# Patient Record
Sex: Male | Born: 1940 | Race: White | Hispanic: No | Smoking: Former smoker
Health system: Southern US, Community
[De-identification: ages and names within clinical notes are randomized; demographics above are authoritative.]

## PROBLEM LIST (undated history)

## (undated) DIAGNOSIS — L570 Actinic keratosis: Secondary | ICD-10-CM

## (undated) DIAGNOSIS — D631 Anemia in chronic kidney disease: Secondary | ICD-10-CM

## (undated) DIAGNOSIS — N529 Male erectile dysfunction, unspecified: Secondary | ICD-10-CM

## (undated) DIAGNOSIS — G8220 Paraplegia, unspecified: Secondary | ICD-10-CM

## (undated) DIAGNOSIS — I10 Essential (primary) hypertension: Secondary | ICD-10-CM

## (undated) DIAGNOSIS — N183 Chronic kidney disease, stage 3 (moderate): Secondary | ICD-10-CM

## (undated) DIAGNOSIS — K5900 Constipation, unspecified: Secondary | ICD-10-CM

## (undated) DIAGNOSIS — K219 Gastro-esophageal reflux disease without esophagitis: Secondary | ICD-10-CM

## (undated) DIAGNOSIS — N400 Enlarged prostate without lower urinary tract symptoms: Secondary | ICD-10-CM

## (undated) DIAGNOSIS — N189 Chronic kidney disease, unspecified: Secondary | ICD-10-CM

## (undated) DIAGNOSIS — G629 Polyneuropathy, unspecified: Secondary | ICD-10-CM

## (undated) DIAGNOSIS — E119 Type 2 diabetes mellitus without complications: Secondary | ICD-10-CM

## (undated) HISTORY — DX: Gastro-esophageal reflux disease without esophagitis: K21.9

## (undated) HISTORY — DX: Chronic kidney disease, stage 3 (moderate): N18.3

## (undated) HISTORY — PX: BACK SURGERY: SHX140

## (undated) HISTORY — DX: Essential (primary) hypertension: I10

## (undated) HISTORY — DX: Polyneuropathy, unspecified: G62.9

## (undated) HISTORY — DX: Benign prostatic hyperplasia without lower urinary tract symptoms: N40.0

## (undated) HISTORY — DX: Type 2 diabetes mellitus without complications: E11.9

## (undated) HISTORY — DX: Actinic keratosis: L57.0

## (undated) HISTORY — DX: Paraplegia, unspecified: G82.20

## (undated) HISTORY — DX: Male erectile dysfunction, unspecified: N52.9

## (undated) HISTORY — DX: Anemia in chronic kidney disease: D63.1

## (undated) HISTORY — DX: Chronic kidney disease, unspecified: N18.9

## (undated) HISTORY — DX: Constipation, unspecified: K59.00

## (undated) HISTORY — PX: REPLACEMENT TOTAL KNEE: SUR1224

---

## 2011-05-05 DIAGNOSIS — N400 Enlarged prostate without lower urinary tract symptoms: Secondary | ICD-10-CM | POA: Insufficient documentation

## 2011-05-05 DIAGNOSIS — N529 Male erectile dysfunction, unspecified: Secondary | ICD-10-CM | POA: Insufficient documentation

## 2011-05-05 DIAGNOSIS — F419 Anxiety disorder, unspecified: Secondary | ICD-10-CM | POA: Insufficient documentation

## 2011-05-05 DIAGNOSIS — G8929 Other chronic pain: Secondary | ICD-10-CM | POA: Insufficient documentation

## 2011-05-05 HISTORY — DX: Male erectile dysfunction, unspecified: N52.9

## 2011-05-05 HISTORY — DX: Benign prostatic hyperplasia without lower urinary tract symptoms: N40.0

## 2011-07-12 DIAGNOSIS — K592 Neurogenic bowel, not elsewhere classified: Secondary | ICD-10-CM | POA: Insufficient documentation

## 2011-07-12 DIAGNOSIS — N319 Neuromuscular dysfunction of bladder, unspecified: Secondary | ICD-10-CM | POA: Insufficient documentation

## 2012-04-12 DIAGNOSIS — N189 Chronic kidney disease, unspecified: Secondary | ICD-10-CM | POA: Insufficient documentation

## 2012-04-12 DIAGNOSIS — D631 Anemia in chronic kidney disease: Secondary | ICD-10-CM

## 2012-04-12 HISTORY — DX: Anemia in chronic kidney disease: D63.1

## 2012-04-12 HISTORY — DX: Chronic kidney disease, unspecified: N18.9

## 2013-09-17 DIAGNOSIS — Z87440 Personal history of urinary (tract) infections: Secondary | ICD-10-CM | POA: Insufficient documentation

## 2013-09-17 DIAGNOSIS — I10 Essential (primary) hypertension: Secondary | ICD-10-CM

## 2013-09-17 DIAGNOSIS — G8220 Paraplegia, unspecified: Secondary | ICD-10-CM

## 2013-09-17 DIAGNOSIS — K219 Gastro-esophageal reflux disease without esophagitis: Secondary | ICD-10-CM | POA: Insufficient documentation

## 2013-09-17 DIAGNOSIS — E785 Hyperlipidemia, unspecified: Secondary | ICD-10-CM | POA: Insufficient documentation

## 2013-09-17 DIAGNOSIS — G4700 Insomnia, unspecified: Secondary | ICD-10-CM | POA: Insufficient documentation

## 2013-09-17 HISTORY — DX: Essential (primary) hypertension: I10

## 2013-09-17 HISTORY — DX: Gastro-esophageal reflux disease without esophagitis: K21.9

## 2013-09-17 HISTORY — DX: Paraplegia, unspecified: G82.20

## 2013-10-23 DIAGNOSIS — N183 Chronic kidney disease, stage 3 unspecified: Secondary | ICD-10-CM

## 2013-10-23 DIAGNOSIS — G629 Polyneuropathy, unspecified: Secondary | ICD-10-CM | POA: Insufficient documentation

## 2013-10-23 HISTORY — DX: Chronic kidney disease, stage 3 unspecified: N18.30

## 2013-10-23 HISTORY — DX: Polyneuropathy, unspecified: G62.9

## 2014-01-22 DIAGNOSIS — K5900 Constipation, unspecified: Secondary | ICD-10-CM | POA: Insufficient documentation

## 2014-01-22 HISTORY — DX: Constipation, unspecified: K59.00

## 2015-09-28 DIAGNOSIS — E119 Type 2 diabetes mellitus without complications: Secondary | ICD-10-CM

## 2015-09-28 HISTORY — DX: Type 2 diabetes mellitus without complications: E11.9

## 2015-11-26 DIAGNOSIS — E559 Vitamin D deficiency, unspecified: Secondary | ICD-10-CM | POA: Insufficient documentation

## 2015-12-03 DIAGNOSIS — Z9181 History of falling: Secondary | ICD-10-CM | POA: Insufficient documentation

## 2016-02-05 ENCOUNTER — Ambulatory Visit (INDEPENDENT_AMBULATORY_CARE_PROVIDER_SITE_OTHER): Payer: 59 | Admitting: Urology

## 2016-02-05 ENCOUNTER — Encounter: Payer: Self-pay | Admitting: Urology

## 2016-02-05 VITALS — BP 148/84 | HR 59 | Ht 73.0 in | Wt 230.4 lb

## 2016-02-05 DIAGNOSIS — N319 Neuromuscular dysfunction of bladder, unspecified: Secondary | ICD-10-CM

## 2016-02-05 NOTE — Progress Notes (Signed)
02/05/2016 11:36 AM   Hector Simmons 1941-05-23 [id number]  Referring provider: Oswaldo Conroy, MD [address] [place], [place] [address]  Chief Complaint  Patient presents with  . New Patient (Initial Visit)    neurogenic bladder     HPI: The patient had C-spine and lumbar spine surgery in 2010 and I believe had a dural leak and this had a neurogenic bladder since. He catheterizes every 4 hours and rarely leaks. He gets an occasional urinary tract infection. He might feel the void spontaneously but rarely. He's never had a kidney stone or previous GU surgery. He is on oral hypoglycemics. He usually is treated with ciprofloxacin.  Modifying factors: There are no other modifying factors  Associated signs and symptoms: There are no other associated signs and symptoms Aggravating and relieving factors: There are no other aggravating or relieving factors Severity: Moderate Duration: Persistent   PMH: No past medical history on file.  Surgical History: No past surgical history on file.  Home Medications:    Medication List       This list is accurate as of: 02/05/16 11:36 AM.  Always use your most recent med list.               aspirin EC 81 MG tablet  Take 81 mg by mouth.     carvedilol 6.25 MG tablet  Commonly known as:  COREG  Take 6.25 mg by mouth.     ciprofloxacin 500 MG tablet  Commonly known as:  CIPRO  Take 500 mg by mouth.     glimepiride 4 MG tablet  Commonly known as:  AMARYL  Take 4 mg by mouth.     lisinopril 40 MG tablet  Commonly known as:  PRINIVIL,ZESTRIL     lovastatin 40 MG tablet  Commonly known as:  MEVACOR     omeprazole 20 MG capsule  Commonly known as:  PRILOSEC     tamsulosin 0.4 MG Caps capsule  Commonly known as:  FLOMAX  Take 0.4 mg by mouth.     traZODone 50 MG tablet  Commonly known as:  DESYREL     Trospium Chloride 60 MG Cp24  Take 60 mg by mouth. Reported on 02/05/2016        Allergies: Not  on File  Family History: No family history on file.  Social History:  reports that he has quit smoking. He does not have any smokeless tobacco history on file. He reports that he drinks alcohol. He reports that he does not use illicit drugs.  ROS: UROLOGY Frequent Urination?: No Hard to postpone urination?: Yes Burning/pain with urination?: No Get up at night to urinate?: Yes Leakage of urine?: Yes Urine stream starts and stops?: No Trouble starting stream?: Yes Do you have to strain to urinate?: Yes Blood in urine?: No Urinary tract infection?: Yes Sexually transmitted disease?: No Injury to kidneys or bladder?: Yes Painful intercourse?: No Weak stream?: No Erection problems?: Yes Penile pain?: Yes  Gastrointestinal Nausea?: No Vomiting?: No Indigestion/heartburn?: No Diarrhea?: No Constipation?: Yes  Constitutional Fever: No Night sweats?: No Weight loss?: No Fatigue?: No  Skin Skin rash/lesions?: No Itching?: No  Eyes Blurred vision?: No Double vision?: No  Ears/Nose/Throat Sore throat?: No Sinus problems?: No  Hematologic/Lymphatic Swollen glands?: No Easy bruising?: No  Cardiovascular Leg swelling?: No Chest pain?: No  Respiratory Cough?: No Shortness of breath?: No  Endocrine Excessive thirst?: No  Musculoskeletal Back pain?: No Joint pain?: No  Neurological Headaches?: No  Dizziness?: No  Psychologic Depression?: Yes Anxiety?: Yes  Physical Exam: BP 148/84 mmHg  Pulse 59  Ht 6\' 1"  (1.854 m)  Wt 230 lb 6.4 oz (104.509 kg)  BMI 30.40 kg/m2  Constitutional:  Alert and oriented, No acute distress. HEENT: [place] AT, moist mucus membranes.  Trachea midline, no masses. Cardiovascular: No clubbing, cyanosis, or edema. Respiratory: Normal respiratory effort, no increased work of breathing. GI: Abdomen is soft, nontender, nondistended, no abdominal masses GU: No CVA tenderness. Decrease sphincter tone and small benign prostate Skin: No  rashes, bruises or suspicious lesions. Lymph: No cervical or inguinal adenopathy. Neurologic: Grossly intact, no focal deficits, moving all 4 extremities. Psychiatric: Normal mood and affect.  Laboratory Data:  Urinalysis No results found for: COLORURINE, APPEARANCEUR, LABSPEC, PHURINE, GLUCOSEU, HGBUR, BILIRUBINUR, KETONESUR, PROTEINUR, UROBILINOGEN, NITRITE, LEUKOCYTESUR  Pertinent Imaging: None  Assessment & Plan:  The patient has a neurogenic bladder clinically. He is very stable and I'm not going to order urodynamics at this stage. He would like to transfer his care here. He does take [facility] and. He is on Flomax. Role of renal ultrasound discussed.  After we spoke we both decided to stop the Flomax. I will get a baseline ultrasound a reassessment in 1 month and then likely when necessary and annually  There are no diagnoses linked to this encounter.  No Follow-up on file.  Martina Sinner, MD  [facility] [address], [address] [place], [place] [address] [phone]

## 2016-02-22 ENCOUNTER — Ambulatory Visit: Payer: Self-pay

## 2016-02-24 ENCOUNTER — Ambulatory Visit
Admission: RE | Admit: 2016-02-24 | Discharge: 2016-02-24 | Disposition: A | Discharge status: Home / Self Care | Payer: Medicare HMO | Source: Ambulatory Visit | Attending: Urology | Admitting: Urology

## 2016-02-24 DIAGNOSIS — N281 Cyst of kidney, acquired: Secondary | ICD-10-CM | POA: Diagnosis not present

## 2016-02-24 DIAGNOSIS — N319 Neuromuscular dysfunction of bladder, unspecified: Secondary | ICD-10-CM | POA: Insufficient documentation

## 2016-02-29 ENCOUNTER — Ambulatory Visit (INDEPENDENT_AMBULATORY_CARE_PROVIDER_SITE_OTHER): Payer: Medicare HMO | Admitting: Urology

## 2016-02-29 ENCOUNTER — Encounter: Payer: Self-pay | Admitting: Urology

## 2016-02-29 ENCOUNTER — Ambulatory Visit: Payer: 59

## 2016-02-29 VITALS — BP 105/66 | HR 72 | Ht 73.0 in | Wt 228.3 lb

## 2016-02-29 DIAGNOSIS — N319 Neuromuscular dysfunction of bladder, unspecified: Secondary | ICD-10-CM | POA: Diagnosis not present

## 2016-02-29 NOTE — Progress Notes (Signed)
02/29/2016 3:47 PM   Hector Simmons Nov 04, 1940 [id number]  Referring provider: Oswaldo Conroy, MD [address] [place] [place], [place] [address]  Chief Complaint  Patient presents with  . Follow-up    neurogenic bladder     HPI: The patient had C-spine and lumbar spine surgery in 2010 and I believe had a dural leak and this had a neurogenic bladder since. He catheterizes every 4 hours and rarely leaks. He gets an occasional urinary tract infection. He might feel the void spontaneously but rarely. He's never had a kidney stone or previous GU surgery. He is on oral hypoglycemics. He usually is treated with ciprofloxacin.  Assessment & Plan: The patient has a neurogenic bladder clinically. He is very stable and I'm not going to order urodynamics at this stage. He would like to transfer his care here. He does take [facility] and. He is on Flomax. Role of renal ultrasound discussed.  After we spoke we both decided to stop the Flomax. I will get a baseline ultrasound a reassessment in 1 month and then likely when necessary and annually  The renal ultrasound is normal  Incomplete bladder emptying stable. No infections.   PMH: Past Medical History  Diagnosis Date  . BP (high blood pressure) 09/17/2013    Last Assessment & Plan:  Hypertension is stable. Continue current treatment regimen. Dietary sodium restriction. Regular aerobic exercise. Continue current medications.   . CN (constipation) 01/22/2014    Last Assessment & Plan:  Start PEG powder every day.  incr water (hard w his situation fo self cathing).    . Acid reflux 09/17/2013    Last Assessment & Plan:  Symptoms managed on ppi, continue current regimen   . Type 2 diabetes mellitus (HCC) 09/28/2015    Overview:  cataract Last Assessment & Plan:  Renal condition is stable. Continue current treatment regimen. Regular aerobic exercise. Continue current medications.   . 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  Last Assessment & Plan:  Refer to podiatry one time per year.    . 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  Last Assessment & Plan:  Continue current exercise regimen to maximize lower extremity strength.  Discussed PT with patient, but patient reports being able to do exercises on his own as taught.     . 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 Chronic kidney disease (CKD), stage III (moderate) 10/23/2013    Last Assessment & Plan:  Renal condition is stable. Continue current treatment regimen.   . 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    . 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  Home Medications:    Medication List       This list is accurate as of: 02/29/16  3:47 PM.  Always use your most recent med list.               aspirin EC 81 MG tablet  Take 81 mg by mouth.     carvedilol 6.25 MG tablet  Commonly known as:  COREG  Take 6.25 mg by mouth.     ciprofloxacin 500 MG tablet  Commonly known as:  CIPRO  Take 500 mg by mouth. Reported on 02/29/2016  glimepiride 4 MG tablet  Commonly known as:  AMARYL  Take 4 mg by mouth.     lisinopril 40 MG tablet  Commonly known as:  PRINIVIL,ZESTRIL     lovastatin 40 MG tablet  Commonly known as:  MEVACOR     omeprazole 20 MG capsule  Commonly known as:  PRILOSEC     tamsulosin 0.4 MG Caps capsule  Commonly known as:  FLOMAX  Take 0.4 mg by mouth. Reported on 02/29/2016     traZODone 50 MG tablet  Commonly known as:  DESYREL     Trospium Chloride 60 MG Cp24  Take 60 mg by mouth. Reported on 02/05/2016        Allergies: No Known Allergies  Family History: No family history on file.  Social History:  reports that he has quit smoking. He does not have any  smokeless tobacco history on file. He reports that he drinks alcohol. He reports that he does not use illicit drugs.  ROS: UROLOGY Frequent Urination?: No Hard to postpone urination?: No Burning/pain with urination?: No Get up at night to urinate?: No Leakage of urine?: No Urine stream starts and stops?: No Trouble starting stream?: No Do you have to strain to urinate?: No Blood in urine?: No Urinary tract infection?: No Sexually transmitted disease?: No Injury to kidneys or bladder?: No Painful intercourse?: No Weak stream?: No Erection problems?: Yes Penile pain?: No  Gastrointestinal Nausea?: No Vomiting?: No Indigestion/heartburn?: No Diarrhea?: No Constipation?: Yes  Constitutional Fever: No Night sweats?: No Weight loss?: No Fatigue?: No  Skin Skin rash/lesions?: No Itching?: No  Eyes Blurred vision?: No Double vision?: No  Ears/Nose/Throat Sore throat?: No Sinus problems?: No  Hematologic/Lymphatic Swollen glands?: No Easy bruising?: No  Cardiovascular Leg swelling?: No Chest pain?: No  Respiratory Cough?: No Shortness of breath?: No  Endocrine Excessive thirst?: No  Musculoskeletal Back pain?: No Joint pain?: No  Neurological Headaches?: No Dizziness?: No  Psychologic Depression?: No Anxiety?: No  Physical Exam: BP 105/66 mmHg  Pulse 72  Ht 6\' 1"  (1.854 m)  Wt 228 lb 4.8 oz (103.556 kg)  BMI 30.13 kg/m2    Laboratory Data:  Urinalysis No results found for: COLORURINE, APPEARANCEUR, LABSPEC, PHURINE, GLUCOSEU, HGBUR, BILIRUBINUR, KETONESUR, PROTEINUR, UROBILINOGEN, NITRITE, LEUKOCYTESUR  Pertinent Imaging: As above  Assessment & Plan:  The patient has a stable neurogenic bladder on [place] with a normal renal ultrasound He takes trospium when necessary when he is traveling to [place] but otherwise does not take it every day   Martina Sinner, MD  [facility] [address] [place], [place] [address] [phone]

## 2016-07-28 ENCOUNTER — Telehealth: Payer: Self-pay | Admitting: Urology

## 2016-07-28 DIAGNOSIS — N319 Neuromuscular dysfunction of bladder, unspecified: Secondary | ICD-10-CM

## 2016-07-28 MED ORDER — TROSPIUM CHLORIDE ER 60 MG PO CP24: 60.0000 mg | ORAL_CAPSULE | Freq: Every day | ORAL | 10 refills | Status: DC | PRN

## 2016-07-28 NOTE — Telephone Encounter (Signed)
Refill sent, trospium is generic for sanctura

## 2016-07-28 NOTE — Telephone Encounter (Signed)
Pt called asking for a refill of his Trospum Chloride 60mg  sent to [facility] on [address] in [place]. If there is a generic, pt would like to have that.  Please advise.

## 2016-08-08 ENCOUNTER — Telehealth: Payer: Self-pay | Admitting: Urology

## 2016-08-08 NOTE — Telephone Encounter (Signed)
Needs refill from [facility] for Ciproflaxin.  He said MacDiarmid would need to call this in, because other has expired.  Please give pt a call [phone]

## 2016-08-19 ENCOUNTER — Other Ambulatory Visit: Payer: Self-pay

## 2016-08-19 DIAGNOSIS — N390 Urinary tract infection, site not specified: Secondary | ICD-10-CM

## 2016-08-19 MED ORDER — CIPROFLOXACIN HCL 500 MG PO TABS: 500.0000 mg | ORAL_TABLET | ORAL | 3 refills | Status: DC | PRN

## 2016-08-22 ENCOUNTER — Other Ambulatory Visit: Payer: Self-pay

## 2016-08-22 DIAGNOSIS — N390 Urinary tract infection, site not specified: Secondary | ICD-10-CM

## 2016-08-22 MED ORDER — CIPROFLOXACIN HCL 500 MG PO TABS: 500.0000 mg | ORAL_TABLET | ORAL | 3 refills | Status: DC | PRN

## 2016-09-08 ENCOUNTER — Telehealth: Payer: Self-pay

## 2016-09-08 DIAGNOSIS — N390 Urinary tract infection, site not specified: Secondary | ICD-10-CM

## 2016-09-08 MED ORDER — CIPROFLOXACIN HCL 500 MG PO TABS: 500.0000 mg | ORAL_TABLET | ORAL | 3 refills | Status: DC | PRN

## 2016-09-08 NOTE — Telephone Encounter (Signed)
Error

## 2017-02-22 ENCOUNTER — Ambulatory Visit
Admission: RE | Admit: 2017-02-22 | Discharge: 2017-02-22 | Disposition: A | Discharge status: Home / Self Care | Payer: Medicare PPO | Source: Ambulatory Visit | Attending: Urology | Admitting: Urology

## 2017-02-22 DIAGNOSIS — N261 Atrophy of kidney (terminal): Secondary | ICD-10-CM | POA: Diagnosis not present

## 2017-02-22 DIAGNOSIS — N281 Cyst of kidney, acquired: Secondary | ICD-10-CM | POA: Insufficient documentation

## 2017-02-22 DIAGNOSIS — N319 Neuromuscular dysfunction of bladder, unspecified: Secondary | ICD-10-CM | POA: Diagnosis not present

## 2017-03-06 ENCOUNTER — Ambulatory Visit: Payer: Medicare HMO

## 2017-03-13 ENCOUNTER — Ambulatory Visit: Payer: Medicare PPO | Admitting: Urology

## 2017-03-13 ENCOUNTER — Encounter: Payer: Self-pay | Admitting: Urology

## 2017-03-13 VITALS — BP 118/61 | HR 79 | Ht 73.0 in | Wt 235.8 lb

## 2017-03-13 DIAGNOSIS — R339 Retention of urine, unspecified: Secondary | ICD-10-CM | POA: Diagnosis not present

## 2017-03-13 MED ORDER — OXYBUTYNIN CHLORIDE ER 10 MG PO TB24: 10.0000 mg | ORAL_TABLET | Freq: Every day | ORAL | 11 refills | Status: DC

## 2017-03-13 MED ORDER — TOLTERODINE TARTRATE ER 4 MG PO CP24: 4.0000 mg | ORAL_CAPSULE | Freq: Every day | ORAL | 11 refills | Status: DC

## 2017-03-13 NOTE — Progress Notes (Signed)
03/13/2017 1:41 PM   Hector Simmons 1941-06-10 [id number]  Referring provider: Oswaldo Conroy, MD [address] [place] [address] [place], [place] [address]  Chief Complaint  Patient presents with  . Follow-up    RUS results    HPI: The patient had C-spine and lumbar spine surgery in 2010 and I believe had a dural leak and this had a neurogenic bladder since. He catheterizes every 4 hours and rarely leaks. He gets an occasional urinary tract infection. He might feel the void spontaneously but rarely.He usually is treated with ciprofloxacin.  Assessment & Plan: The patient has a neurogenic bladder clinically. He is very stable and I'm not going to order urodynamics at this stage. He would like to transfer his care here. He does take [facility] and. He is on Flomax.   After we spoke we both decided to stop the Flomax. I will get a baseline ultrasound a reassessment in 1 month and then likely when necessary and annually  Today  The renal ultrasound is normal  Catheterizes 6 or 7 times a day. Little to no sensation in the perineal area. 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     PMH: Past Medical History:  Diagnosis Date  . Acid reflux 09/17/2013   Last Assessment & Plan:  Symptoms managed on ppi, continue current regimen   . 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    . BP (high blood pressure) 09/17/2013   Last Assessment & Plan:  Hypertension is stable. Continue current treatment regimen. Dietary sodium restriction. Regular aerobic exercise. Continue current medications.   . Chronic kidney disease (CKD), stage III (moderate) 10/23/2013   Last Assessment & Plan:  Renal condition is stable. Continue current treatment regimen.   . CN (constipation) 01/22/2014   Last Assessment & Plan:   Start PEG powder every day.  incr water (hard w his situation fo self cathing).    . 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  Last Assessment & Plan:  Continue current exercise regimen to maximize lower extremity strength.  Discussed PT with patient, but patient reports being able to do exercises on his own as taught.     . 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  Last Assessment & Plan:  Refer to podiatry one time per year.    . Type 2 diabetes mellitus (HCC) 09/28/2015   Overview:  cataract Last Assessment & Plan:  Renal condition is stable. Continue current treatment regimen. Regular aerobic exercise. Continue current medications.     Surgical History: History reviewed. No pertinent surgical history.  Home Medications:  Allergies as of 03/13/2017      Reactions   No Known Allergies       Medication List       Accurate as of 03/13/17  1:41 PM. Always use your most recent med list.          aspirin EC 81 MG tablet Take 81 mg by mouth.   carvedilol 6.25 MG tablet Commonly known as:  COREG Take 6.25 mg by mouth.   ciprofloxacin 500 MG tablet Commonly known as:  CIPRO Take 1  tablet (500 mg total) by mouth as needed. Reported on 02/29/2016   glimepiride 4 MG tablet Commonly known as:  AMARYL Take 4 mg by mouth.   lisinopril 40 MG tablet Commonly known as:  PRINIVIL,ZESTRIL   lovastatin 40 MG tablet Commonly known as:  MEVACOR   omeprazole 20 MG capsule Commonly known as:  PRILOSEC   tamsulosin 0.4 MG Caps capsule Commonly known as:  FLOMAX Take 0.4 mg by mouth. Reported on 02/29/2016   traZODone 50 MG tablet Commonly known as:  DESYREL   Trospium Chloride 60 MG Cp24 Take 1 capsule (60 mg total) by mouth daily as needed. Reported on 02/05/2016        Allergies:  Allergies  Allergen Reactions  . No Known Allergies     Family History: History reviewed. No pertinent family history.  Social History:  reports that he has quit smoking. He has never used smokeless tobacco. He reports that he drinks alcohol. He reports that he does not use drugs.  ROS: UROLOGY Frequent Urination?: No Hard to postpone urination?: No Burning/pain with urination?: No Get up at night to urinate?: No Leakage of urine?: No Urine stream starts and stops?: No Trouble starting stream?: No Do you have to strain to urinate?: No Blood in urine?: No Urinary tract infection?: No Sexually transmitted disease?: No Injury to kidneys or bladder?: Yes Painful intercourse?: No Weak stream?: No Erection problems?: Yes Penile pain?: No  Gastrointestinal Nausea?: No Vomiting?: No Indigestion/heartburn?: No Diarrhea?: No Constipation?: No  Constitutional Fever: No Night sweats?: No Weight loss?: No Fatigue?: No  Skin Skin rash/lesions?: No Itching?: No  Eyes Blurred vision?: No Double vision?: No  Ears/Nose/Throat Sore throat?: No Sinus problems?: No  Hematologic/Lymphatic Swollen glands?: No Easy bruising?: No  Cardiovascular Leg swelling?: No Chest pain?: No  Respiratory Cough?: No Shortness of breath?: No  Endocrine Excessive thirst?: No  Musculoskeletal Back pain?: No Joint pain?: No  Neurological Headaches?: No Dizziness?: No  Psychologic Depression?: No Anxiety?: No  Physical Exam: BP 118/61 (BP Location: Left Arm, Patient Position: Sitting, Cuff Size: Normal)   Pulse 79   Ht 6\' 1"  (1.854 m)   Wt 235 lb 12.8 oz (107 kg)   BMI 31.11 kg/m   Constitutional:  Alert and oriented, No acute distress.   Laboratory Data:  Urinalysis No results found for: COLORURINE, APPEARANCEUR, LABSPEC, PHURINE, GLUCOSEU, HGBUR, BILIRUBINUR, KETONESUR, PROTEINUR, UROBILINOGEN, NITRITE, LEUKOCYTESUR  Pertinent  Imaging: none  Assessment & Plan:  The patient was given a prescription of Detrol and oxybutynin ER 10 mg the see if it works as well as trospium but might be cheaper. If these don't work well he will call back for trospium. I will see him in one year with an ultrasound  There are no diagnoses linked to this encounter.  No Follow-up on file.  Martina Sinner, MD  [facility] [address], [address] [place], [place] [address] [phone]

## 2017-03-21 ENCOUNTER — Telehealth: Payer: Self-pay

## 2017-03-21 NOTE — Telephone Encounter (Signed)
Pt called in requesting to have oxybutynin 10mg  tab,  (1 tab @hs ), changed to oxybutynin 5mg  tab, (2 tabs @hs ), because it is cheaper for him to get through his insurance as a 90 day supply. Advised pt would fwd request to MD b/c requesting change in med direction. Patient verbalized understanding.

## 2017-03-28 MED ORDER — OXYBUTYNIN CHLORIDE ER 5 MG PO TB24: 10.0000 mg | ORAL_TABLET | Freq: Every day | ORAL | 0 refills | Status: DC

## 2017-03-28 NOTE — Telephone Encounter (Signed)
Patient called back today asking why he has not heard back from anyone about this medication change request. I explained that Dr. Matilde Sprang is not in the clinic and will not be back until the 23rd. He said that this was not urgent and could wait until he gets back then to be addressed. Can someone make sure that this gets addressed with him on the 23rd please and thank you?   Sharyn Lull

## 2017-03-28 NOTE — Telephone Encounter (Signed)
Called patient. Apologized for message not being addressed. Explained sent Oxybutynin 5 mg to verified pharmacy on file as requested. Patient verbalized understanding and was grateful.

## 2017-04-05 ENCOUNTER — Telehealth: Payer: Self-pay | Admitting: Urology

## 2017-04-05 MED ORDER — OXYBUTYNIN CHLORIDE 5 MG PO TABS: 5.0000 mg | ORAL_TABLET | Freq: Two times a day (BID) | ORAL | 1 refills | Status: DC

## 2017-04-05 NOTE — Telephone Encounter (Signed)
Pt came in to office after leaving 3 voice mails, with no return call.  He does NOT want the ER on the end of Oxybutnin.  He uses [facility].  He also does not want the Detrol.  Please give pt a call, he is very upset.  [phone]

## 2017-04-05 NOTE — Telephone Encounter (Signed)
Spoke to patient. Advised will send Rx for Oxybutynin 5 MG to mail pharmacy. Went over directions: 1 tab bid per Dr. Pilar Jarvis. Patient verbalized understanding.

## 2017-06-06 ENCOUNTER — Telehealth: Payer: Self-pay | Admitting: Urology

## 2017-06-06 DIAGNOSIS — R339 Retention of urine, unspecified: Secondary | ICD-10-CM

## 2017-06-06 MED ORDER — OXYBUTYNIN CHLORIDE 5 MG PO TABS: 5.0000 mg | ORAL_TABLET | Freq: Two times a day (BID) | ORAL | 1 refills | Status: DC

## 2017-06-06 NOTE — Telephone Encounter (Signed)
Patient is calling asking for a refill on his oxybutin 5mg  he said he needs it to be done just like last time. See previous notes from July. He said it took a long time to get this done last time and he will be out in two weeks.  Please call him back   Thanks, Sharyn Lull

## 2017-06-06 NOTE — Addendum Note (Signed)
Addended by: Lestine Box on: 06/06/2017 04:13 PM   Modules accepted: Orders

## 2017-06-06 NOTE — Telephone Encounter (Signed)
Spoke with pt in reference to oxybutynin. Medication has been refilled as requested.

## 2017-09-13 ENCOUNTER — Other Ambulatory Visit: Payer: Self-pay

## 2017-09-13 DIAGNOSIS — R339 Retention of urine, unspecified: Secondary | ICD-10-CM

## 2017-09-13 MED ORDER — OXYBUTYNIN CHLORIDE 5 MG PO TABS: 5.0000 mg | ORAL_TABLET | Freq: Two times a day (BID) | ORAL | 2 refills | Status: DC

## 2017-12-05 ENCOUNTER — Telehealth: Payer: Self-pay | Admitting: Urology

## 2017-12-05 NOTE — Telephone Encounter (Signed)
Pt walked into clinic during lunch asking to speak to a nurse about his medication, Oxybutinin.  He has questions and will be awaiting a phone call. Please advise.  Thanks.

## 2017-12-11 ENCOUNTER — Ambulatory Visit (INDEPENDENT_AMBULATORY_CARE_PROVIDER_SITE_OTHER): Payer: Medicare PPO | Admitting: Urology

## 2017-12-11 ENCOUNTER — Encounter: Payer: Self-pay | Admitting: Urology

## 2017-12-11 VITALS — BP 138/71 | HR 76 | Ht 73.0 in | Wt 221.8 lb

## 2017-12-11 DIAGNOSIS — R339 Retention of urine, unspecified: Secondary | ICD-10-CM | POA: Diagnosis not present

## 2017-12-11 DIAGNOSIS — N319 Neuromuscular dysfunction of bladder, unspecified: Secondary | ICD-10-CM

## 2017-12-11 MED ORDER — TRIMETHOPRIM 100 MG PO TABS: 100.0000 mg | ORAL_TABLET | Freq: Every day | ORAL | 11 refills | Status: DC

## 2017-12-11 MED ORDER — OXYBUTYNIN CHLORIDE 5 MG PO TABS: 5.0000 mg | ORAL_TABLET | Freq: Three times a day (TID) | ORAL | 3 refills | Status: DC

## 2017-12-11 NOTE — Progress Notes (Signed)
12/11/2017 10:12 AM   Hector Simmons Nov 06, 1940 [id number]  Referring provider: Oswaldo Conroy, MD [address] [place], [place] [address]  No chief complaint on file.   HPI: The patient had C-spine and lumbar spine surgery in 2010 and I believe had a dural leak and this had a neurogenic bladder since. He catheterizes every 4 hours and rarely leaks. He gets an occasional urinary tract infection. He might feel the void spontaneously but rarely.He usually is treated with ciprofloxacin.  The patient has a neurogenic bladder clinically. He is very stable and I'm not going to order urodynamics at this stage. He would like to transfer his care here. He does take Sanctura ER   The renal ultrasound is normal  Catheterizes 6 or 7 times a day. Little to no sensation in the perineal area. 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  The patient was given a prescription of Detrol and oxybutynin ER 10 mg the see if it works as well as trospium but might be cheaper. If these don't work well he will call back for trospium.  Today Normal u/sound June 2018 Patient still catheterizes approximately 6 times per day.  He used to take oxybutynin once in the morning and once at night but in the last 6 months started catheterizing more at night.  Now when he takes 2 at night he can sleep for hours.  He does not take Detrol.  He does not take Flomax.  About 5 times per year he will have to catheterize more frequently and the symptoms settle down when he is treated with ciprofloxacin  Modifying factors: There are no other modifying factors  Associated signs and symptoms: There are no other associated signs and symptoms Aggravating and relieving factors: There are no other aggravating or relieving factors Severity: Moderate Duration: Persistent    PMH: Past Medical History:   Diagnosis Date  . Acid reflux 09/17/2013   Last Assessment & Plan:  Symptoms managed on ppi, continue current regimen   . 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    . BP (high blood pressure) 09/17/2013   Last Assessment & Plan:  Hypertension is stable. Continue current treatment regimen. Dietary sodium restriction. Regular aerobic exercise. Continue current medications.   . 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 Continue current treatment regimen.   . CN (constipation) 01/22/2014   Last Assessment & Plan:  Start PEG powder every day.  incr water (hard w his situation fo self cathing).    . 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  Last Assessment & Plan:  Continue current exercise regimen to maximize lower extremity strength.  Discussed PT with patient, but patient reports being able to do exercises on his own as taught.     . 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  Last Assessment & Plan:  Refer to podiatry one time per year.    . Type 2 diabetes mellitus (HCC) 09/28/2015   Overview:  cataract Last Assessment & Plan:  Renal condition is stable. Continue current  treatment regimen. Regular aerobic exercise. Continue current medications.     Surgical History: History reviewed. No pertinent surgical history.  Home Medications:  Allergies as of 12/11/2017      Reactions   No Known Allergies       Medication List        Accurate as of 12/11/17 10:12 AM. Always use your most recent med list.          aspirin EC 81 MG tablet Take 81 mg by mouth.   carvedilol 6.25 MG tablet Commonly known as:  COREG Take 6.25 mg by  mouth.   ciprofloxacin 500 MG tablet Commonly known as:  CIPRO Take 1 tablet (500 mg total) by mouth as needed. Reported on 02/29/2016   glimepiride 4 MG tablet Commonly known as:  AMARYL Take 4 mg by mouth.   lisinopril 40 MG tablet Commonly known as:  PRINIVIL,ZESTRIL   lovastatin 40 MG tablet Commonly known as:  MEVACOR   omeprazole 20 MG capsule Commonly known as:  PRILOSEC   oxybutynin 5 MG tablet Commonly known as:  DITROPAN Take 1 tablet (5 mg total) by mouth 2 (two) times daily.   tamsulosin 0.4 MG Caps capsule Commonly known as:  FLOMAX Take 0.4 mg by mouth. Reported on 02/29/2016   tolterodine 4 MG 24 hr capsule Commonly known as:  DETROL LA Take 1 capsule (4 mg total) by mouth daily.   traZODone 50 MG tablet Commonly known as:  DESYREL   Trospium Chloride 60 MG Cp24 Take 1 capsule (60 mg total) by mouth daily as needed. Reported on 02/05/2016       Allergies:  Allergies  Allergen Reactions  . No Known Allergies     Family History: History reviewed. No pertinent family history.  Social History:  reports that he has quit smoking. He has never used smokeless tobacco. He reports that he drinks alcohol. He reports that he does not use drugs.  ROS:                                        Physical Exam: There were no vitals taken for this visit.  Constitutional:  Alert and oriented, No acute distress.  Laboratory Data:  Urinalysis No results found for: COLORURINE, APPEARANCEUR, LABSPEC, PHURINE, GLUCOSEU, HGBUR, BILIRUBINUR, KETONESUR, PROTEINUR, UROBILINOGEN, NITRITE, LEUKOCYTESUR  Pertinent Imaging: As above  Assessment & Plan: Likely the patient has a neurogenic bladder.  I talked to him about trimethoprim suppression therapy and this may also improve some of his nighttime frequency.  He also understands that he would be making more urine at night.  I agree taking oxybutynin 1 in the morning and 2 at night is a good idea  (oxy 5 mg).  His last renal ultrasound was approximately 9 months ago.  I would see him in a year with a repeat ultrasound.  His prostate was small and benign and he had significantly decreased anal sphincter tone.    3 times a day oxybutynin prescribed.  Trimethoprim prescribed.  I will see him in 1 year with an ultrasound.  I do not think he needs to add the Sanctura but he did ask me about this and I educated him about double therapy.  There are no diagnoses linked to this encounter.  No follow-ups on file.  Martina Sinner, MD  [facility] [address], [address] [place], [place] [address] [phone]

## 2018-03-29 ENCOUNTER — Ambulatory Visit: Payer: Medicare PPO

## 2018-07-16 ENCOUNTER — Telehealth: Payer: Self-pay | Admitting: Urology

## 2018-07-16 DIAGNOSIS — R339 Retention of urine, unspecified: Secondary | ICD-10-CM

## 2018-07-16 NOTE — Telephone Encounter (Signed)
Can you call him in a refill on his oxybutynin 5mg ? He called and said he was told we had denied a refill?   Sharyn Lull

## 2018-07-17 MED ORDER — OXYBUTYNIN CHLORIDE 5 MG PO TABS: 5.0000 mg | ORAL_TABLET | Freq: Three times a day (TID) | ORAL | 3 refills | Status: DC

## 2018-07-17 NOTE — Telephone Encounter (Signed)
Done

## 2018-09-10 ENCOUNTER — Telehealth: Payer: Self-pay | Admitting: Urology

## 2018-09-10 MED ORDER — TRIMETHOPRIM 100 MG PO TABS: 100.0000 mg | ORAL_TABLET | Freq: Every day | ORAL | 11 refills | Status: DC

## 2018-09-10 NOTE — Telephone Encounter (Signed)
Pt needs a RX for Trimethoprim 100 mg sent to [facility].  THIS IS A MACDIARMID PT.

## 2018-12-10 ENCOUNTER — Ambulatory Visit
Admission: RE | Admit: 2018-12-10 | Discharge: 2018-12-10 | Disposition: A | Discharge status: Home / Self Care | Payer: Medicare PPO | Source: Ambulatory Visit | Attending: Urology | Admitting: Urology

## 2018-12-10 ENCOUNTER — Other Ambulatory Visit: Payer: Self-pay

## 2018-12-10 DIAGNOSIS — N319 Neuromuscular dysfunction of bladder, unspecified: Secondary | ICD-10-CM | POA: Diagnosis present

## 2018-12-10 DIAGNOSIS — R339 Retention of urine, unspecified: Secondary | ICD-10-CM | POA: Diagnosis present

## 2018-12-12 ENCOUNTER — Other Ambulatory Visit: Payer: Self-pay

## 2018-12-12 ENCOUNTER — Telehealth (INDEPENDENT_AMBULATORY_CARE_PROVIDER_SITE_OTHER): Payer: Medicare PPO | Admitting: Urology

## 2018-12-12 DIAGNOSIS — N281 Cyst of kidney, acquired: Secondary | ICD-10-CM

## 2018-12-12 DIAGNOSIS — R351 Nocturia: Secondary | ICD-10-CM

## 2018-12-12 DIAGNOSIS — N319 Neuromuscular dysfunction of bladder, unspecified: Secondary | ICD-10-CM | POA: Diagnosis not present

## 2018-12-12 MED ORDER — OXYBUTYNIN CHLORIDE 5 MG PO TABS: 5.0000 mg | ORAL_TABLET | Freq: Three times a day (TID) | ORAL | 3 refills | Status: DC

## 2018-12-12 NOTE — Progress Notes (Signed)
Virtual Visit via Telephone Note  I connected with Hector Simmons on 12/12/2018 at 1151 by telephone and verified that I am speaking with the correct person using two identifiers.  They are located at home.    I am located at my home.    This visit type was conducted due to national recommendations for restrictions regarding the COVID-19 Pandemic (e.g. social distancing).  This format is felt to be most appropriate for this patient at this time.  All issues noted in this document were discussed and addressed.  No physical exam was performed.   I discussed the limitations, risks, security and privacy concerns of performing an evaluation and management service by telephone and the availability of in person appointments. I also discussed with the patient that there may be a patient responsible charge related to this service. The patient expressed understanding and agreed to proceed.   History of Present Illness: Hector Simmons is a 78 year old Caucasian male with a history of a neurogenic bladder for a one year follow up.  Patient is catheterizes himself 5 times daily.  He uses one straight cath weekly and sterilizes the catheter by steaming it in the microwave after each use.  He is on a daily trimethoprim for rUTI's, but he is also taking Cipro 500 mg BID for episodes of cloudy foul smelling urine which last for four days.  He is taking the Cipro approximately every month.  He is taking the oxybutynin 5 mg , one tablet in the am and two tablets in the pm.  He has noticed an increase in his nocturia.  He is having to get up 2 times at night to cath where in the past he had been getting up none to one time a night.  He denies any leg edema.  He was very active with water aerobics until recently due to the gym being closed for COVID-19, but he states the nocturia had worsened prior to the gym closing.  He states his blood sugars have been good, he avoids fluid and caffeine at night and he feels he does not  take in an excessive amount of salt.  He was diagnosed with sleep apnea a few years ago, but he had lost a lot of weight and he no longer sleeps with the CPAP machine.    RUS on 12/10/2018 noted no bladder postvoid residual.  Unchanged bilateral renal simple cysts.   Observations/Objective: CLINICAL DATA:  Neurogenic bladder.  EXAM: RENAL / URINARY TRACT ULTRASOUND COMPLETE  COMPARISON:  Renal ultrasound dated February 22, 2017.  FINDINGS: Right Kidney:  Renal measurements: 10.8 x 6.0 x 5.9 cm = volume: 198 mL . Echogenicity within normal limits. No mass or hydronephrosis visualized. Several simple cysts are again noted, the largest measuring 5.8 cm, slightly increased in size when compared to prior study.  Left Kidney:  Renal measurements: 9.8 x 6.1 x 5.2 cm = volume: 162 mL. Echogenicity within normal limits. No mass or hydronephrosis visualized. Two simple cysts are again identified, the largest in the lower pole measuring 1.7 cm.  Bladder:  Appears normal for degree of bladder distention. No postvoid residual.  IMPRESSION: 1. No bladder postvoid residual. 2. Unchanged bilateral renal simple cysts.   Electronically Signed   By: Titus Dubin M.D.   On: 12/10/2018 15:13 I have independently reviewed the films with the patient and note the cysts.    Assessment and Plan:  1. Neurogenic bladder Discussed with the patient that he needs to  use a brand new catheter each time he caths, but he states he cannot afford to use a new catheter with every cathing Discussed stopping the trimethoprim at this time and he is agreeable Cipro has not been prescribed since 2017 and he is not in need of a refill, so he must be taking the Cipro sparingly Continue the oxybutynin 5 mg (one am and 2 pm)-refills given Repeat RUS in one year  2. Nocturia Not bothersome to the patient at this time  3. Renal cysts Benign and unchanged   Follow Up Instructions:  Hector Simmons to  follow up in one year with RUS and office visit with Dr. Matilde Sprang.  He will call to make the appointment.    I discussed the assessment and treatment plan with the patient. The patient was provided an opportunity to ask questions and all were answered. The patient agreed with the plan and demonstrated an understanding of the instructions.   The patient was advised to call back or seek an in-person evaluation if the symptoms worsen or if the condition fails to improve as anticipated.  I provided 23 minutes of non-face-to-face time during this encounter.   Markeshia Giebel, PA-C

## 2018-12-17 ENCOUNTER — Ambulatory Visit: Payer: Medicare PPO

## 2018-12-17 ENCOUNTER — Ambulatory Visit: Payer: Medicare PPO | Admitting: Urology

## 2019-09-20 ENCOUNTER — Telehealth: Payer: Self-pay | Admitting: Urology

## 2019-09-20 DIAGNOSIS — N319 Neuromuscular dysfunction of bladder, unspecified: Secondary | ICD-10-CM

## 2019-09-20 NOTE — Telephone Encounter (Signed)
Pt called and wants to know why he was denied a refill on his Oxybtunin. Please advise.

## 2019-09-23 MED ORDER — TRIMETHOPRIM 100 MG PO TABS: 100.0000 mg | ORAL_TABLET | Freq: Every day | ORAL | 0 refills | Status: DC

## 2019-09-23 MED ORDER — OXYBUTYNIN CHLORIDE 5 MG PO TABS: 5.0000 mg | ORAL_TABLET | Freq: Three times a day (TID) | ORAL | 0 refills | Status: DC

## 2019-09-23 NOTE — Telephone Encounter (Signed)
That is fine to refill his trimethoprim at this time as he states he cannot afford to use a new catheter every time he caths.  I would refer him onto [facility] and/or [facility] to see if he can acquire his additional catheters at an affordable price.

## 2019-09-23 NOTE — Telephone Encounter (Signed)
Spoke with patient and rescheduled his 1 year follow up in April and sent a refill of oxybutinin. Patient states he also would like a refill of Trimethoprim, per Shannon's last note it was discussed to stop the medication and patient was agreeable. Patient does not recall this and states he needs medication to prevent infection due to self cath

## 2019-09-23 NOTE — Telephone Encounter (Signed)
Spoke with patient and he states he is doing well with catheters right now, will keep follow up in april

## 2019-09-23 NOTE — Telephone Encounter (Signed)
If his app is in April they will not call him until closer to his app time to schedule it.   Sharyn Lull

## 2019-11-11 ENCOUNTER — Telehealth: Payer: Self-pay

## 2019-11-11 NOTE — Telephone Encounter (Signed)
[name] with [facility] calls and LM asking if fax for cath orders has been received, orders need to be signed by Dr.MacDiarmid as well as last 2 office notes faxed.

## 2019-11-12 NOTE — Telephone Encounter (Signed)
Spoke with [name]-advised orders will not be signed till Monday when provider is in office. Needed office notes faxed, faxed last two office notes.

## 2019-11-13 NOTE — Telephone Encounter (Signed)
Loma Sousa called from [facility] to review patient notes faxed yesterday, attempted to return call left vmail

## 2019-11-18 ENCOUNTER — Telehealth: Payer: Self-pay

## 2019-11-18 NOTE — Telephone Encounter (Signed)
ERROR

## 2019-12-12 ENCOUNTER — Telehealth: Payer: Self-pay | Admitting: Urology

## 2019-12-12 ENCOUNTER — Other Ambulatory Visit: Payer: Self-pay

## 2019-12-12 ENCOUNTER — Ambulatory Visit
Admission: RE | Admit: 2019-12-12 | Discharge: 2019-12-12 | Disposition: A | Discharge status: Home / Self Care | Payer: Medicare PPO | Source: Ambulatory Visit | Attending: Urology | Admitting: Urology

## 2019-12-12 DIAGNOSIS — N281 Cyst of kidney, acquired: Secondary | ICD-10-CM | POA: Diagnosis present

## 2019-12-12 NOTE — Telephone Encounter (Signed)
Paisley from [facility] letting us know she faxed an information guide over for patient's catheters.

## 2019-12-16 ENCOUNTER — Ambulatory Visit: Payer: Medicare PPO | Admitting: Urology

## 2019-12-16 ENCOUNTER — Encounter: Payer: Self-pay | Admitting: Urology

## 2019-12-16 ENCOUNTER — Other Ambulatory Visit: Payer: Self-pay

## 2019-12-16 VITALS — BP 116/62 | HR 86 | Ht 73.0 in | Wt 221.0 lb

## 2019-12-16 DIAGNOSIS — R339 Retention of urine, unspecified: Secondary | ICD-10-CM

## 2019-12-16 DIAGNOSIS — N281 Cyst of kidney, acquired: Secondary | ICD-10-CM | POA: Diagnosis not present

## 2019-12-16 DIAGNOSIS — R3914 Feeling of incomplete bladder emptying: Secondary | ICD-10-CM

## 2019-12-16 DIAGNOSIS — N319 Neuromuscular dysfunction of bladder, unspecified: Secondary | ICD-10-CM

## 2019-12-16 DIAGNOSIS — N401 Enlarged prostate with lower urinary tract symptoms: Secondary | ICD-10-CM

## 2019-12-16 MED ORDER — TRIMETHOPRIM 100 MG PO TABS: 100.0000 mg | ORAL_TABLET | Freq: Every day | ORAL | 3 refills | Status: DC

## 2019-12-16 MED ORDER — OXYBUTYNIN CHLORIDE 5 MG PO TABS: 5.0000 mg | ORAL_TABLET | Freq: Three times a day (TID) | ORAL | 0 refills | Status: DC

## 2019-12-16 NOTE — Progress Notes (Addendum)
12/16/2019 9:19 AM   Hector Simmons 1941/05/19 [id number]  Referring provider: Oswaldo Conroy, MD [address] [place],  [place] [address]  No chief complaint on file.   HPI: The patient had C-spine and lumbar spine surgery in 2010 and I believe had a dural leak and this had a neurogenic bladder since. He catheterizes every 4 hours and rarely leaks. He gets an occasional urinary tract infection.   The patient has a neurogenic bladder clinically. He is very stable and I'm not going to order urodynamics at this stage. He would like to transfer his care here.   Catheterizes 6 or 7 times a day. Little to no sensation in the perineal area. 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  The patient was given a prescription of Detrol and oxybutynin ER 10 mg the see if it works as well as trospium but might be cheaper. If these don't work well he will call back for trospium.  He used to take oxybutynin once in the morning and once at night but in the last 6 months started catheterizing more at night.  Now when he takes 2 at night he can sleep for hours.  He does not take Detrol.  He does not take Flomax.  About 5 times per year he will have to catheterize more frequently and the symptoms settle down when he is treated with ciprofloxacin  Likely the patient has a neurogenic bladder.  I talked to him about trimethoprim suppression therapy and this may also improve some of his nighttime frequency.  He also understands that he would be making more urine at night.  I agree taking oxybutynin 1 in the morning and 2 at night is a good idea (oxy 5 mg).  His last renal ultrasound was approximately 9 months ago.  I would see him in a year with a repeat ultrasound.  His prostate was small and benign and he had significantly decreased anal sphincter tone.    3 times a day  oxybutynin prescribed.  Trimethoprim prescribed.  I will see him in 1 year with an ultrasound.  I do not think he needs to add the Sanctura but he did ask me about this and I educated him about double therapy.  Today  He was still taking the 3 oxybutynin a day.  Renal ultrasound April 2021 was normal.  Incomplete bladder emptying stable.  On daily trimethoprim and describes no infections.  He is pleased with this.  Not infected today.     PMH: Past Medical History:  Diagnosis Date  . Acid reflux 09/17/2013   Last Assessment & Plan:  Symptoms managed on ppi, continue current regimen   . 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    . BP (high blood pressure) 09/17/2013   Last Assessment & Plan:  Hypertension is stable. Continue current treatment regimen. Dietary sodium restriction. Regular aerobic exercise. Continue current medications.   . 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 Continue current treatment regimen.   . CN (constipation) 01/22/2014   Last Assessment & Plan:  Start PEG powder every day.  incr water (hard w his situation fo self cathing).    . 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  Last Assessment & Plan:  Continue current exercise regimen to maximize lower extremity strength.  Discussed PT with patient, but patient reports being able to do exercises on his own as taught.     . 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  Last Assessment & Plan:  Refer to podiatry one time per year.    . Type 2 diabetes mellitus (HCC) 09/28/2015   Overview:  cataract Last Assessment & Plan:  Renal condition is stable. Continue  current treatment regimen. Regular aerobic exercise. Continue current medications.     Surgical History: No past surgical history on file.  Home Medications:  Allergies as of 12/16/2019      Reactions   No Known Allergies       Medication List       Accurate as of December 16, 2019  9:19 AM. If you have any questions, ask your nurse or doctor.        aspirin EC 81 MG tablet Take 81 mg by mouth.   carvedilol 6.25 MG tablet Commonly known as: COREG Take 6.25 mg by mouth.   glimepiride 4 MG tablet Commonly known as: AMARYL Take 4 mg by mouth.   lisinopril 40 MG tablet Commonly known as: ZESTRIL   lovastatin 40 MG tablet Commonly known as: MEVACOR   omeprazole 20 MG capsule Commonly known as: PRILOSEC   oxybutynin 5 MG tablet Commonly known as: DITROPAN Take 1 tablet (5 mg total) by mouth 3 (three) times daily.   tamsulosin 0.4 MG Caps capsule Commonly known as: FLOMAX Take 0.4 mg by mouth. Reported on 02/29/2016   tolterodine 4 MG 24 hr capsule Commonly known as: Detrol LA Take 1 capsule (4 mg total) by mouth daily.   traZODone 50 MG tablet Commonly known as: DESYREL   trimethoprim 100 MG tablet Commonly known as: TRIMPEX Take 1 tablet (100 mg total) by mouth daily.   Trospium Chloride 60 MG Cp24 Take 1 capsule (60 mg total) by mouth daily as needed. Reported on 02/05/2016       Allergies:  Allergies  Allergen Reactions  . No Known Allergies     Family History: No family history on file.  Social History:  reports that he has quit smoking. He has never used smokeless tobacco. He reports current alcohol use. He reports that he does not use drugs.  ROS:                                        Physical Exam: There were no vitals taken for this visit.  Constitutional:  Alert and oriented, No acute distress.   Laboratory Data: No results found for: WBC, HGB, HCT, MCV, PLT  No results found for: CREATININE  No results found  for: PSA  No results found for: TESTOSTERONE  No results found for: HGBA1C  Urinalysis No results found for: COLORURINE, APPEARANCEUR, LABSPEC, PHURINE, GLUCOSEU, HGBUR, BILIRUBINUR, KETONESUR, PROTEINUR, UROBILINOGEN, NITRITE, LEUKOCYTESUR  Pertinent Imaging:   Assessment & Plan:This patient has a neurogenic bladder and requires a sterile catheter to be passed 7 times a day for his incomplete bladder emptying.  This and the daily trimethoprim have reduced his bladder infections.  I represcribed his trimethoprim and oxybutynin.  I will see  him in 1 year with a renal ultrasound  This patient is 79 years of age and not only has a neurogenic bladder but has an enlarged prostate with benign prostatic hyperplasia.  His enlarged prostate should be managed with a coud catheter and he uses them 7 times daily.  There are no diagnoses linked to this encounter.  No follow-ups on file.  Martina Sinner, MD  [facility] [address] [place], [place] [address] [phone]

## 2019-12-23 ENCOUNTER — Telehealth: Payer: Self-pay | Admitting: Urology

## 2019-12-23 DIAGNOSIS — N400 Enlarged prostate without lower urinary tract symptoms: Secondary | ICD-10-CM | POA: Insufficient documentation

## 2019-12-23 NOTE — Telephone Encounter (Signed)
Form addended-refaxed.

## 2019-12-23 NOTE — Telephone Encounter (Signed)
Pt is calling  He is very anxious his catheter got denied and he is in need of  them he states it has been a week now and he is worried to get a UTI. I informed him we had to refax a form and that nurse would call him first thing in the morning to discuss this

## 2019-12-23 NOTE — Telephone Encounter (Signed)
Incoming call from pt's catheter company, they state that they received an rx for the pt's supplies however the form was not signed or dated, the form also cannot have the word "addemdum" written in. In addition the form should state that the pt needs coude tip catheters due to BPH and neruogenic bladder. They are having a new form faxed over to your attention.

## 2019-12-24 NOTE — Telephone Encounter (Signed)
Patient informed, notes updated and diagnosis code addended forms refaxed

## 2020-02-02 ENCOUNTER — Other Ambulatory Visit: Payer: Self-pay | Admitting: Urology

## 2020-02-02 DIAGNOSIS — N319 Neuromuscular dysfunction of bladder, unspecified: Secondary | ICD-10-CM

## 2020-03-02 ENCOUNTER — Other Ambulatory Visit: Payer: Self-pay

## 2020-03-02 ENCOUNTER — Telehealth: Payer: Self-pay

## 2020-03-02 MED ORDER — TRIMETHOPRIM 100 MG PO TABS: 100.0000 mg | ORAL_TABLET | Freq: Every day | ORAL | 3 refills | Status: DC

## 2020-03-02 MED ORDER — SULFAMETHOXAZOLE-TRIMETHOPRIM 400-80 MG PO TABS: 1.0000 | ORAL_TABLET | Freq: Every day | ORAL | 3 refills | Status: DC

## 2020-03-02 NOTE — Telephone Encounter (Signed)
Hector Simmons sent a letter stating trimethoprim 100mg  is on back order until late July and asked if we could call in sulfamethoxazole 400 mg- trimethoprim 80 mg. As per Dr. Matilde Sprang ok to send that [facility] in

## 2020-04-01 ENCOUNTER — Other Ambulatory Visit: Payer: Self-pay | Admitting: Dermatology

## 2020-04-22 ENCOUNTER — Other Ambulatory Visit: Payer: Self-pay

## 2020-04-22 ENCOUNTER — Ambulatory Visit: Payer: Medicare PPO | Admitting: Dermatology

## 2020-04-22 DIAGNOSIS — L300 Nummular dermatitis: Secondary | ICD-10-CM | POA: Diagnosis not present

## 2020-04-22 DIAGNOSIS — L570 Actinic keratosis: Secondary | ICD-10-CM | POA: Diagnosis not present

## 2020-04-22 MED ORDER — CLOBETASOL PROPIONATE 0.05 % EX SOLN: CUTANEOUS | 1 refills | Status: DC

## 2020-04-22 NOTE — Progress Notes (Signed)
   Follow-Up Visit   Subjective  Hector Simmons is a 79 y.o. 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 Patient has used clobetasol/CeraVe mix in the past, which helped.).  He needs rfs.  Also wants skin check since he hasn't been seen in a while.   The following portions of the chart were reviewed this encounter and updated as appropriate:      Review of Systems:  No other skin or systemic complaints except as noted in HPI or Assessment and Plan.  Objective  Well appearing patient in no apparent distress; mood and affect are within normal limits.  All skin waist up examined.  Objective  Trunk, Extremities: Small pink papules on bil flanks.  Objective  Crown Scalp (10): Erythematous thin papules/macules with gritty scale.    Assessment & Plan   Skin cancer screening performed today.  Actinic Damage - diffuse scaly erythematous macules with underlying dyspigmentation - Recommend daily broad spectrum sunscreen SPF 30+ to sun-exposed areas, reapply every 2 hours as needed.  - Call for new or changing lesions.  Seborrheic Keratoses - Stuck-on, waxy, tan-brown papules and plaques  - Discussed benign etiology and prognosis. - Observe - Call for any changes  Lentigines - Scattered tan macules - Discussed due to sun exposure - Benign, observe - Call for any changes Melanocytic Nevi - Tan-brown and/or pink-flesh-colored symmetric macules and papules - Benign appearing on exam today - Observation - Call clinic for new or changing moles - Recommend daily use of broad spectrum spf 30+ sunscreen to sun-exposed areas.   Purpura - Violaceous macules and patches - Benign - Related to age, sun damage and/or use of blood thinners - Observe - Can use OTC arnica containing moisturizer such as Dermend Bruise Formula if desired - Call for worsening or other concerns  Telangiectasia - Dilated blood vessels on the nose -  Benign appearing on exam - Call for changes  Nummular dermatitis Trunk, Extremities  Improved with topical treatment Continue clobetasol/CeraVe mix QD/BID prn itchy rash.  Clobetasol solution - Patient to mix in 1 jar of CeraVe Cream dsp 23mL 3Rf.  Topical steroids (such as triamcinolone, fluocinolone, fluocinonide, mometasone, clobetasol, halobetasol, betamethasone, hydrocortisone) can cause thinning and lightening of the skin if they are used for too long in the same area. Your physician has selected the right strength medicine for your problem and area affected on the body. Please use your medication only as directed by your physician to prevent side effects.    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 May schedule in winter  Destruction of lesion - Crown Scalp  Destruction method: cryotherapy   Informed consent: discussed and consent obtained   Lesion destroyed using liquid nitrogen: Yes   Region frozen until ice ball extended beyond lesion: Yes   Outcome: patient tolerated procedure well with no complications   Post-procedure details: wound care instructions given     Return in about 6 months (around 10/23/2020) for AKs.  IJamesetta Orleans, CMA, am acting as scribe for Brendolyn Patty, MD .  Documentation: I have reviewed the above documentation for accuracy and completeness, and I agree with the above.  Brendolyn Patty MD

## 2020-04-22 NOTE — Patient Instructions (Addendum)
Cryotherapy Aftercare  . Wash gently with soap and water everyday.   Marland Kitchen Apply Vaseline and Band-Aid daily until healed.   Eczema Skin Care  Buy TWO 16oz jars of CeraVe moisturizing cream  [facility], [facility], [facility] (no prescription needed)  Costs about $15 per jar   Jar #1: Use as a moisturizer as needed. Can be applied to any area of the body. Use twice daily to unaffected areas.  Jar #2: Pour one 22ml bottle of clobetasol 0.05% solution into jar, mix well. Label this jar to indicate the medication has been added. Use twice daily to affected areas. Do not apply to face, groin or underarms.  Moisturizer may burn or sting initially. Try for at least 4 weeks.    Seborrheic Keratosis  What causes seborrheic keratoses? Seborrheic keratoses are harmless, common skin growths that first appear during adult life.  As time goes by, more growths appear.  Some people may develop a large number of them.  Seborrheic keratoses appear on both covered and uncovered body parts.  They are not caused by sunlight.  The tendency to develop seborrheic keratoses can be inherited.  They vary in color from skin-colored to gray, brown, or even black.  They can be either smooth or have a rough, warty surface.   Seborrheic keratoses are superficial and look as if they were stuck on the skin.  Under the microscope this type of keratosis looks like layers upon layers of skin.  That is why at times the top layer may seem to fall off, but the rest of the growth remains and re-grows.    Treatment Seborrheic keratoses do not need to be treated, but can easily be removed in the office.  Seborrheic keratoses often cause symptoms when they rub on clothing or jewelry.  Lesions can be in the way of shaving.  If they become inflamed, they can cause itching, soreness, or burning.  Removal of a seborrheic keratosis can be accomplished by freezing, burning, or surgery. If any spot bleeds, scabs, or grows rapidly, please return to have  it checked, as these can be an indication of a skin cancer.

## 2020-05-05 ENCOUNTER — Telehealth: Payer: Self-pay

## 2020-05-05 NOTE — Telephone Encounter (Signed)
Patient called regarding his Clobetasol prescription. He states the prescription is too expensive and [employer] has something to offer him for free but did not know the name. Do you have any suggestions of a alternative he might could use that would be cheaper?

## 2020-05-05 NOTE — Telephone Encounter (Signed)
Can send in Hector Simmons - [facility] 0.1% cream 80 gm x 3 (3 months supply) qd/bid to aas body until itchy rash cleared. Avoid f/g/a. He would not mix this into the CeraVe cream like he did with the clobetasol solution.  He would continue to use the plain CeraVe cream or anti-itch CeraVe cream 1-2 times per day all over body.

## 2020-05-06 MED ORDER — TRIAMCINOLONE ACETONIDE 0.1 % EX CREA: 1.0000 "application " | TOPICAL_CREAM | CUTANEOUS | 0 refills | Status: DC

## 2020-05-06 NOTE — Telephone Encounter (Signed)
Prescription sent in and patient advised of medication change. °

## 2020-05-13 NOTE — Telephone Encounter (Signed)
[employer] sent a fax requesting Mometasone 0.1% sol 60 mL instead of the [facility] 0.1% cream.

## 2020-05-19 MED ORDER — MOMETASONE FUROATE 0.1 % EX SOLN: Freq: Every day | CUTANEOUS | 0 refills | Status: DC

## 2020-05-19 NOTE — Addendum Note (Signed)
Addended by: Johnsie Kindred R on: 05/19/2020 05:44 PM   Modules accepted: Orders

## 2020-05-19 NOTE — Telephone Encounter (Signed)
Was the [facility] cream denied?  Can send in the mometasone solution and mix it into the tub of CeraVe cream as previously discussed with the clobetasol solution, and apply bid.  It will not be as strong as it would be using the clobetasol, but it still may work for him.  He can let us know if it isn't helping.

## 2020-05-19 NOTE — Telephone Encounter (Signed)
Prescription sent in and patient advised.

## 2020-08-24 IMAGING — US RENAL/URINARY TRACT ULTRASOUND
1 series · 14 of 25 positions shown · non-contrast
Comparison: Renal ultrasound dated February 22, 2017.

CLINICAL DATA: Neurogenic bladder.

EXAM:
RENAL / URINARY TRACT ULTRASOUND COMPLETE

[Series 1: renal/urinary tract ultrasound · 69 acquisitions, 14 frames shown]
[im 1/69]
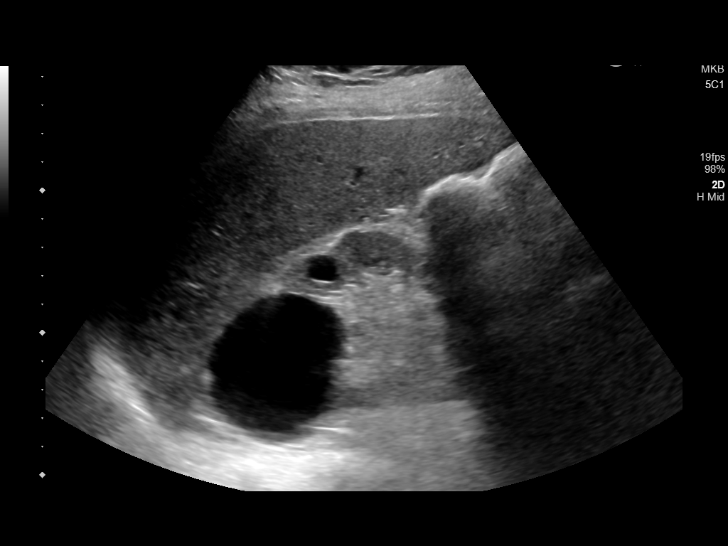
[im 6/69]
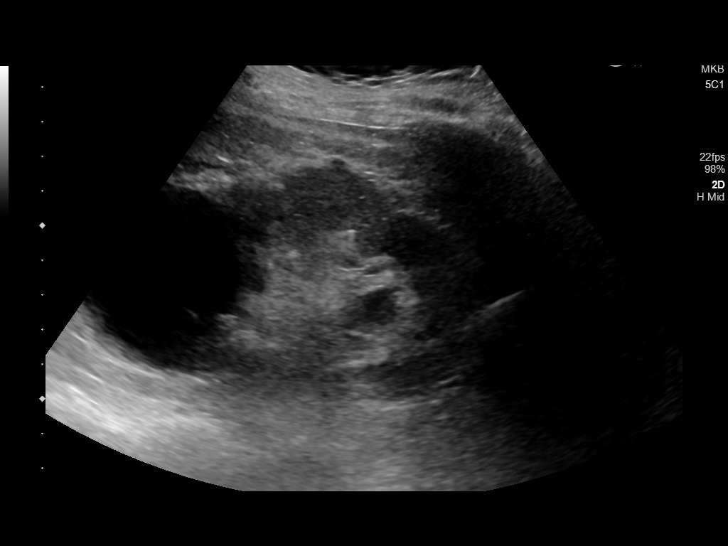
[im 12/69]
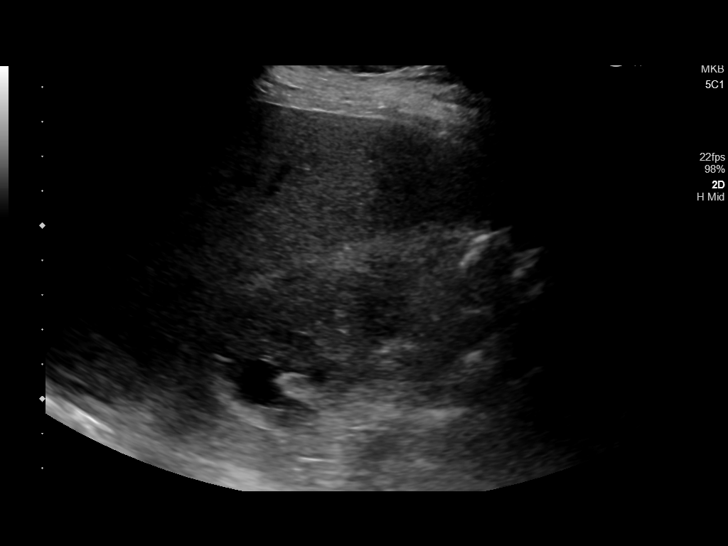
[im 18/69]
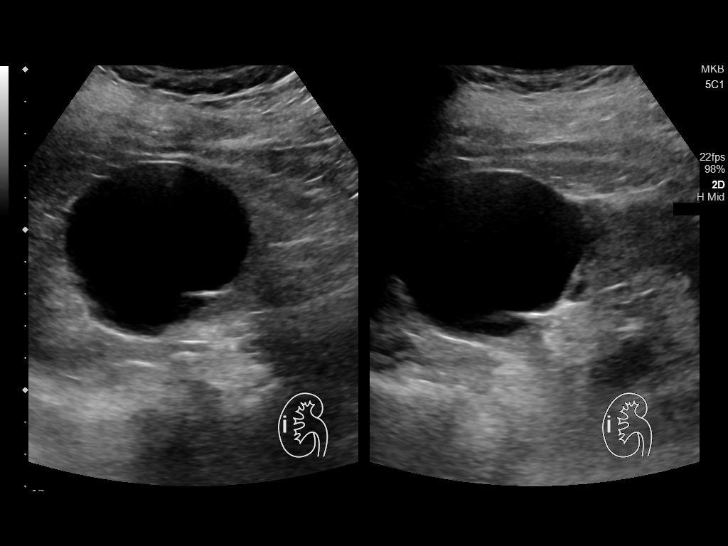
[im 23/69]
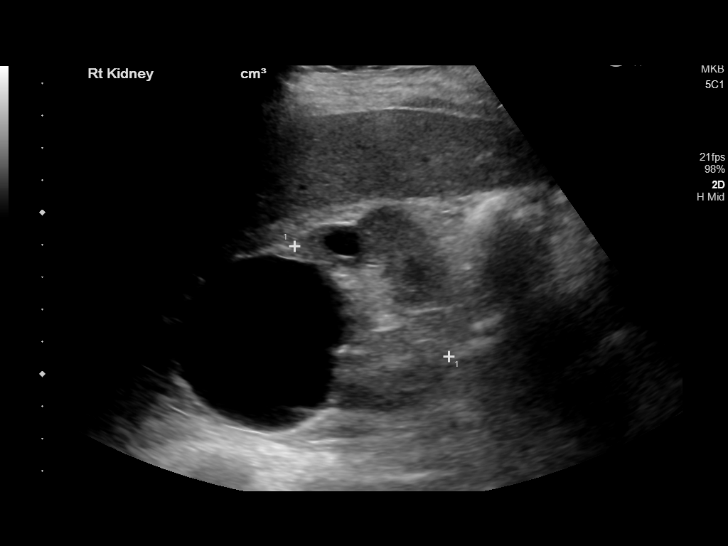
[im 26/69]
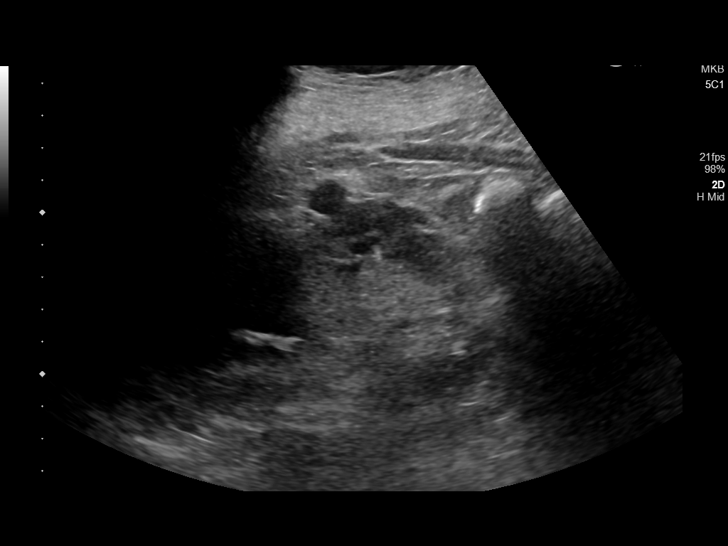
[im 32/69]
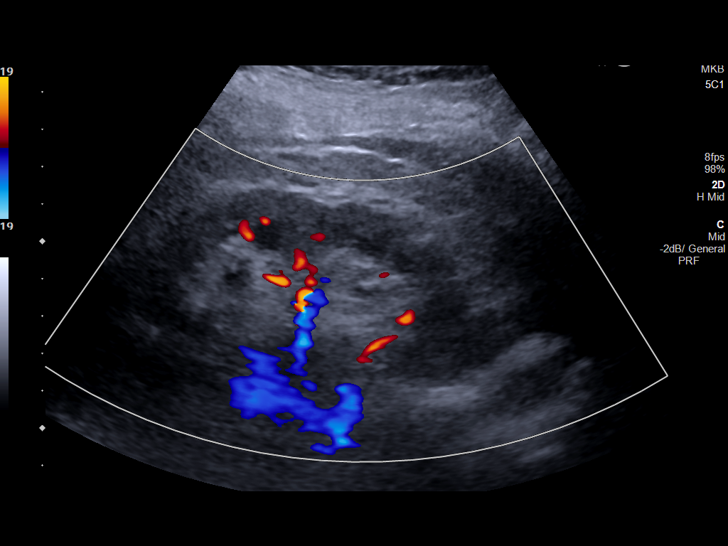
[im 37/69]
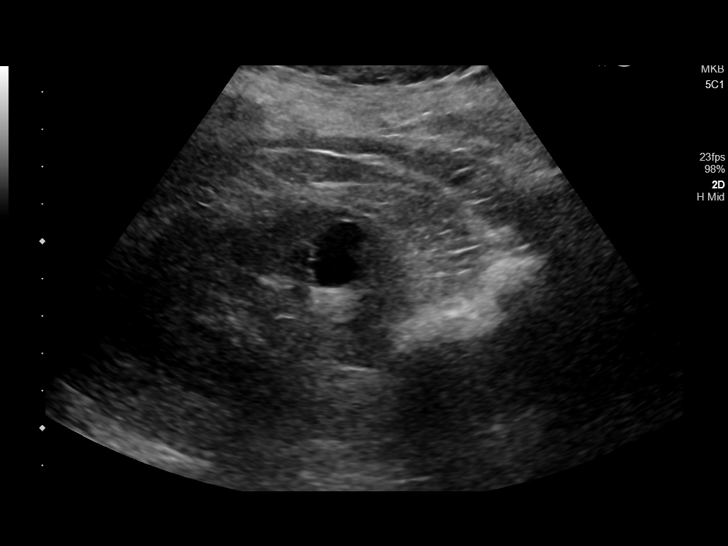
[im 43/69]
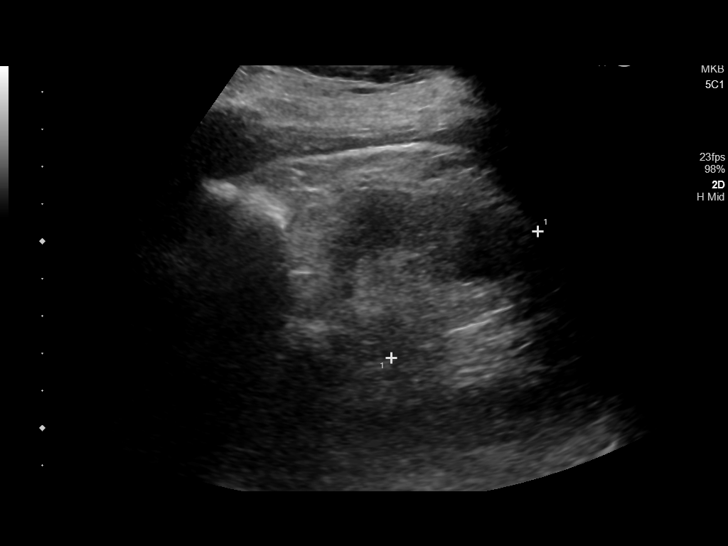
[im 46/69]
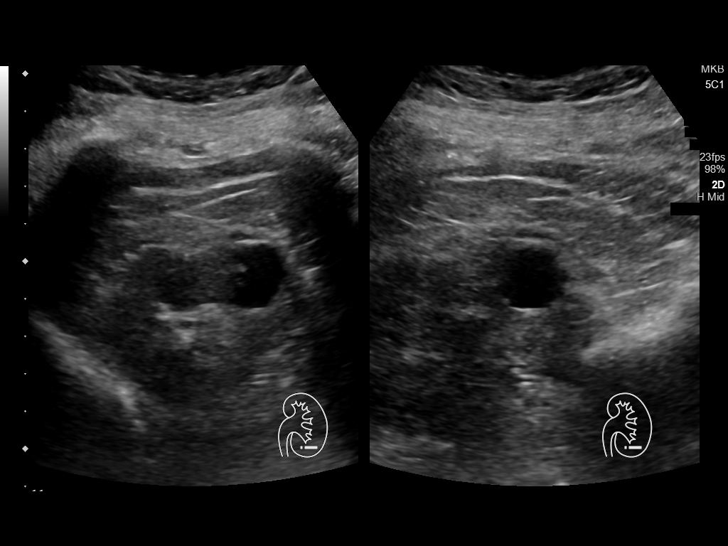
[im 52/69]
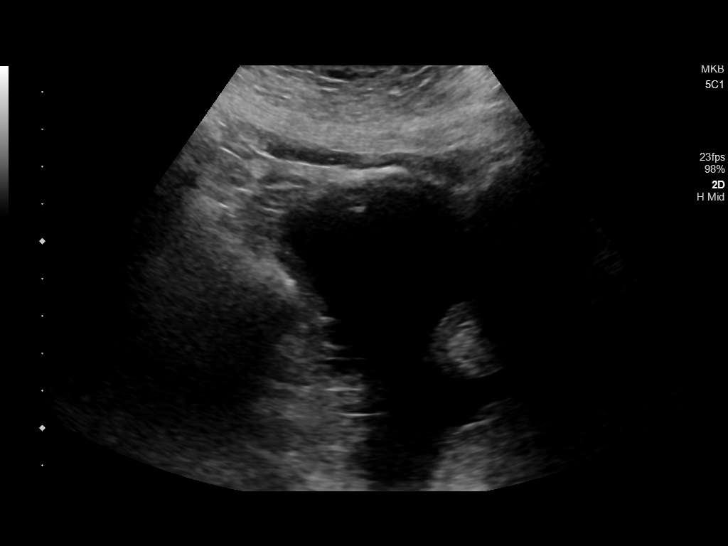
[im 57/69]
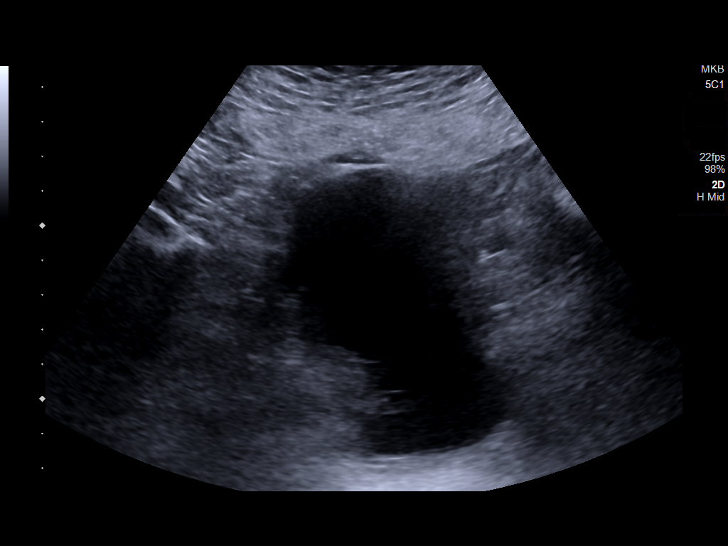
[im 63/69]
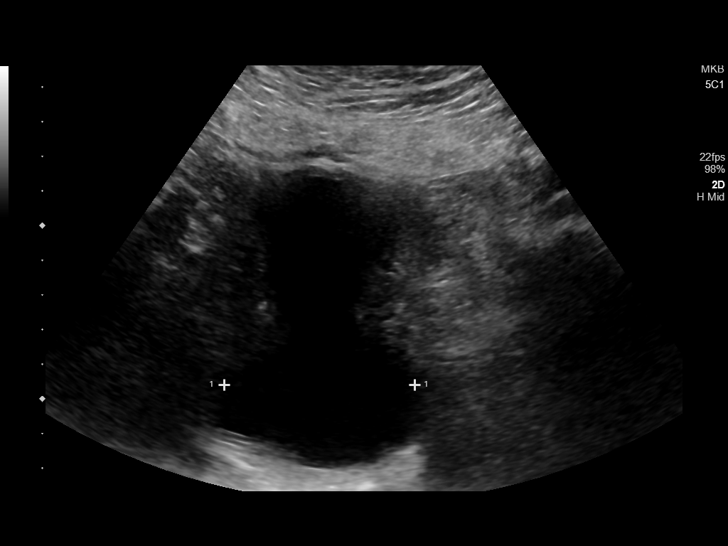
[im 69/69]
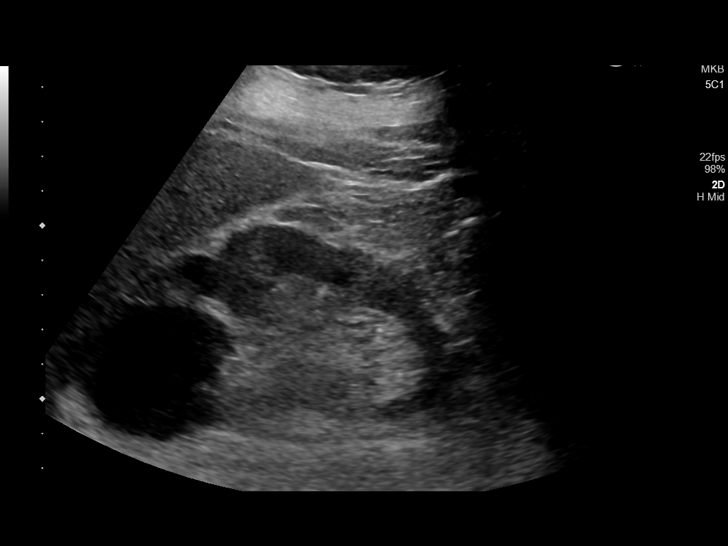

[14 of 25 positions shown; findings below may reference images not displayed]

FINDINGS: Right Kidney:

Renal measurements: 10.8 x 6.0 x 5.9 cm = volume: 198 mL .
Echogenicity within normal limits. No mass or hydronephrosis
visualized. Several simple cysts are again noted, the largest
measuring 5.8 cm, slightly increased in size when compared to prior
study.

Left Kidney:

Renal measurements: 9.8 x 6.1 x 5.2 cm = volume: 162 mL.
Echogenicity within normal limits. No mass or hydronephrosis
visualized. Two simple cysts are again identified, the largest in
the lower pole measuring 1.7 cm.

Bladder:

Appears normal for degree of bladder distention. No postvoid
residual.
IMPRESSION: 1. No bladder postvoid residual.
2. Unchanged bilateral renal simple cysts.

## 2020-10-29 ENCOUNTER — Other Ambulatory Visit: Payer: Self-pay

## 2020-10-29 DIAGNOSIS — N319 Neuromuscular dysfunction of bladder, unspecified: Secondary | ICD-10-CM

## 2020-10-29 MED ORDER — OXYBUTYNIN CHLORIDE 5 MG PO TABS: 5.0000 mg | ORAL_TABLET | Freq: Three times a day (TID) | ORAL | 3 refills | Status: DC

## 2020-10-29 NOTE — Telephone Encounter (Signed)
Pharmacy sent it med refill. Medication sent to pharm. Left message for pt

## 2020-11-04 ENCOUNTER — Other Ambulatory Visit: Payer: Self-pay

## 2020-11-04 DIAGNOSIS — N319 Neuromuscular dysfunction of bladder, unspecified: Secondary | ICD-10-CM

## 2020-11-04 MED ORDER — OXYBUTYNIN CHLORIDE 5 MG PO TABS: 5.0000 mg | ORAL_TABLET | Freq: Three times a day (TID) | ORAL | 3 refills | Status: DC

## 2020-11-04 NOTE — Telephone Encounter (Signed)
Medication sent to [facility].  

## 2020-11-09 ENCOUNTER — Ambulatory Visit: Payer: Medicare HMO | Admitting: Dermatology

## 2020-11-09 ENCOUNTER — Other Ambulatory Visit: Payer: Self-pay

## 2020-11-09 ENCOUNTER — Encounter: Payer: Self-pay | Admitting: Dermatology

## 2020-11-09 DIAGNOSIS — L570 Actinic keratosis: Secondary | ICD-10-CM

## 2020-11-09 DIAGNOSIS — L300 Nummular dermatitis: Secondary | ICD-10-CM

## 2020-11-09 DIAGNOSIS — L578 Other skin changes due to chronic exposure to nonionizing radiation: Secondary | ICD-10-CM

## 2020-11-09 DIAGNOSIS — H0012 Chalazion right lower eyelid: Secondary | ICD-10-CM | POA: Diagnosis not present

## 2020-11-09 DIAGNOSIS — D692 Other nonthrombocytopenic purpura: Secondary | ICD-10-CM | POA: Diagnosis not present

## 2020-11-09 MED ORDER — MOMETASONE FUROATE 0.1 % EX SOLN: Freq: Every day | CUTANEOUS | 3 refills | Status: DC

## 2020-11-09 MED ORDER — DOXYCYCLINE HYCLATE 100 MG PO CAPS
100.0000 mg | ORAL_CAPSULE | Freq: Two times a day (BID) | ORAL | 0 refills | Status: AC
Start: 2020-11-09 — End: 2020-11-19

## 2020-11-09 NOTE — Progress Notes (Signed)
Follow-Up Visit   Subjective  Hector Simmons is a 80 y.o. male who presents for the following: Actinic Keratosis (Patient here for 6 month follow-up Aks of the scalp. 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 He is improved using mometasone lotion mixed in CeraVe Cream.).   The following portions of the chart were reviewed this encounter and updated as appropriate:       Review of Systems:  No other skin or systemic complaints except as noted in HPI or Assessment and Plan.  Objective  Well appearing patient in no apparent distress; mood and affect are within normal limits.  A focused examination was performed including face, scalp, arms. Relevant physical exam findings are noted in the Assessment and Plan.  Objective  trunk, extremities: Xerosis, rash currently clear  Objective  Scalp x 9, right forearm x 6, R index finger x 1 (16): Erythematous thin papules/macules with gritty scale.   Objective  Right Lower Eyelid: 4.68mm erythematous papule, non-tender  Images     Assessment & Plan  Nummular dermatitis trunk, extremities  Improved.  Recommend mild soap and CeraVe moisturizing cream 1-2 times daily.   Continue mometasone lotion/CeraVe mix qd/bid prn itchy rash.  Topical steroids (such as triamcinolone, fluocinolone, fluocinonide, mometasone, clobetasol, halobetasol, betamethasone, hydrocortisone) can cause thinning and lightening of the skin if they are used for too long in the same area. Your physician has selected the right strength medicine for your problem and area affected on the body. Please use your medication only as directed by your physician to prevent side effects.    AK (actinic keratosis) (16) Scalp x 9, right forearm x 6, R index finger x 1  Destruction of lesion - Scalp x 9, right forearm x 6, R index finger x 1  Destruction method: cryotherapy   Destruction method comment:   Electrodessication Informed consent: discussed and consent obtained   Lesion destroyed using liquid nitrogen: Yes   Region frozen until ice ball extended beyond lesion: Yes   Outcome: patient tolerated procedure well with no complications   Post-procedure details: wound care instructions given   Additional details:  Prior to procedure, discussed risks of blister formation, small wound, skin dyspigmentation, or rare scar following cryotherapy.  Chalazion of right lower eyelid Right Lower Eyelid  Recommend warm compress x 15 mins twice daily.  Start doxycyline 100mg  take 1 po BID x 10 days with food dsp #20 0Rf.  Doxycycline should be taken with food to prevent nausea. Do not lay down for 30 minutes after taking. Be cautious with sun exposure and use good sun protection while on this medication. Pregnant women should not take this medication.    Observation.   If not improved in a few weeks, recommend patient see eye doctor.  Ordered Medications: doxycycline (VIBRAMYCIN) 100 MG capsule   Actinic Damage - Severe, chronic, secondary to cumulative UV radiation exposure over time - diffuse scaly erythematous macules and papules with underlying dyspigmentation - Discussed Prescription "Field Treatment" for Severe, Chronic Confluent Actinic Changes with Pre-Cancerous Actinic Keratoses Field treatment involves treatment of an entire area of skin that has confluent Actinic Changes (Sun/ Ultraviolet light damage) and PreCancerous Actinic Keratoses by method of PhotoDynamic Therapy (PDT) and/or prescription Topical Chemotherapy agents such as 5-fluorouracil, 5-fluorouracil/calcipotriene, and/or imiquimod.  The purpose is to decrease the number of clinically evident and subclinical PreCancerous lesions to prevent progression to development of skin cancer by chemically destroying early precancer changes  that may or may not be visible.  It has been shown to reduce the risk of developing skin cancer in  the treated area. As a result of treatment, redness, scaling, crusting, and open sores may occur during treatment course. One or more than one of these methods may be used and may have to be used several times to control, suppress and eliminate the PreCancerous changes. Discussed treatment course, expected reaction, and possible side effects. - Recommend daily broad spectrum sunscreen SPF 30+ to sun-exposed areas, reapply every 2 hours as needed.  - Call for new or changing lesions. - Will schedule photodynamic therapy to the scalp x 2, 1 mo apart  Purpura - Chronic; persistent and recurrent.  Treatable, but not curable. - Violaceous macules and patches - Benign - Related to trauma, age, sun damage and/or use of blood thinners, chronic use of topical and/or oral steroids - Observe - Can use OTC arnica containing moisturizer such as Dermend Bruise Formula if desired - Call for worsening or other concerns   Return for PDT to the scalp x 2, 1 month apart. Dr S f/u in 6 mos Aks/UBSE.Lindi Adie, CMA, am acting as scribe for Brendolyn Patty, MD .  Documentation: I have reviewed the above documentation for accuracy and completeness, and I agree with the above.  Brendolyn Patty MD

## 2020-11-09 NOTE — Patient Instructions (Addendum)
Cryotherapy Aftercare  . Wash gently with soap and water everyday.   Marland Kitchen Apply Vaseline and Band-Aid daily until healed.    Topical steroids (such as triamcinolone, fluocinolone, fluocinonide, mometasone, clobetasol, halobetasol, betamethasone, hydrocortisone) can cause thinning and lightening of the skin if they are used for too long in the same area. Your physician has selected the right strength medicine for your problem and area affected on the body. Please use your medication only as directed by your physician to prevent side effects.    Actinic Damage - Severe, chronic, secondary to cumulative UV radiation exposure over time - diffuse scaly erythematous macules and papules with underlying dyspigmentation - Discussed Prescription "Field Treatment" for Severe, Chronic Confluent Actinic Changes with Pre-Cancerous Actinic Keratoses Field treatment involves treatment of an entire area of skin that has confluent Actinic Changes (Sun/ Ultraviolet light damage) and PreCancerous Actinic Keratoses by method of PhotoDynamic Therapy (PDT) and/or prescription Topical Chemotherapy agents such as 5-fluorouracil, 5-fluorouracil/calcipotriene, and/or imiquimod.  The purpose is to decrease the number of clinically evident and subclinical PreCancerous lesions to prevent progression to development of skin cancer by chemically destroying early precancer changes that Deepika Decatur or Merikay Lesniewski not be visible.  It has been shown to reduce the risk of developing skin cancer in the treated area. As a result of treatment, redness, scaling, crusting, and open sores Jimmy Stipes occur during treatment course. One or more than one of these methods Aemon Koeller be used and Milik Gilreath have to be used several times to control, suppress and eliminate the PreCancerous changes. Discussed treatment course, expected reaction, and possible side effects. - Recommend daily broad spectrum sunscreen SPF 30+ to sun-exposed areas, reapply every 2 hours as needed.  - Call for new  or changing lesions.   Recommend warm compress to right eye for 15 minutes 1-2 times a day.  Doxycycline should be taken with food to prevent nausea. Do not lay down for 30 minutes after taking. Be cautious with sun exposure and use good sun protection while on this medication. Pregnant women should not take this medication.    If bump on eyelid not improved in a couple of weeks, recommend seeing eye doctor.

## 2020-11-17 ENCOUNTER — Ambulatory Visit (INDEPENDENT_AMBULATORY_CARE_PROVIDER_SITE_OTHER): Payer: Medicare HMO

## 2020-11-17 ENCOUNTER — Other Ambulatory Visit: Payer: Self-pay

## 2020-11-17 DIAGNOSIS — L570 Actinic keratosis: Secondary | ICD-10-CM

## 2020-11-17 MED ORDER — AMINOLEVULINIC ACID HCL 20 % EX SOLR
1.0000 "application " | Freq: Once | CUTANEOUS | Status: AC
  Administered 2020-11-17: 354 mg via TOPICAL

## 2020-11-17 NOTE — Progress Notes (Signed)
Patient completed PDT therapy today.  1. AK (actinic keratosis) Scalp  Photodynamic therapy - Scalp Procedure discussed: discussed risks, benefits, side effects. and alternatives   Prep: site scrubbed/prepped with acetone   Location:  Scalp Number of lesions:  Multiple Type of treatment:  Blue light Aminolevulinic Acid (see MAR for details): Levulan Number of Levulan sticks used:  1 Incubation time (minutes):  120 Number of minutes under lamp:  16 Number of seconds under lamp:  40 Cooling:  Floor fan Outcome: patient tolerated procedure well with no complications   Post-procedure details: sunscreen applied     

## 2020-11-17 NOTE — Patient Instructions (Signed)

## 2020-11-30 ENCOUNTER — Other Ambulatory Visit: Payer: Self-pay

## 2020-11-30 DIAGNOSIS — N281 Cyst of kidney, acquired: Secondary | ICD-10-CM

## 2020-12-09 ENCOUNTER — Ambulatory Visit: Payer: Medicare HMO

## 2020-12-15 ENCOUNTER — Ambulatory Visit: Payer: Medicare HMO

## 2020-12-21 ENCOUNTER — Ambulatory Visit: Payer: Self-pay | Admitting: Urology

## 2020-12-22 ENCOUNTER — Other Ambulatory Visit: Payer: Self-pay

## 2020-12-22 ENCOUNTER — Ambulatory Visit (INDEPENDENT_AMBULATORY_CARE_PROVIDER_SITE_OTHER): Payer: Medicare HMO

## 2020-12-22 DIAGNOSIS — L570 Actinic keratosis: Secondary | ICD-10-CM

## 2020-12-22 MED ORDER — AMINOLEVULINIC ACID HCL 20 % EX SOLR
1.0000 "application " | Freq: Once | CUTANEOUS | Status: AC
  Administered 2020-12-22: 354 mg via TOPICAL

## 2020-12-22 NOTE — Patient Instructions (Signed)

## 2020-12-22 NOTE — Progress Notes (Signed)
Patient completed PDT therapy today.  1. AK (actinic keratosis) Scalp  Photodynamic therapy - Scalp Procedure discussed: discussed risks, benefits, side effects. and alternatives   Prep: site scrubbed/prepped with acetone   Location:  Scalp Number of lesions:  Multiple Type of treatment:  Blue light Aminolevulinic Acid (see MAR for details): Levulan Number of Levulan sticks used:  1 Incubation time (minutes):  120 Number of minutes under lamp:  16 Number of seconds under lamp:  40 Cooling:  Floor fan Outcome: patient tolerated procedure well with no complications   Post-procedure details: sunscreen applied     

## 2021-02-15 ENCOUNTER — Ambulatory Visit
Admission: RE | Admit: 2021-02-15 | Discharge: 2021-02-15 | Disposition: A | Discharge status: Home / Self Care | Payer: Medicare HMO | Source: Ambulatory Visit | Attending: Urology | Admitting: Urology

## 2021-02-15 ENCOUNTER — Other Ambulatory Visit: Payer: Self-pay

## 2021-02-15 DIAGNOSIS — N281 Cyst of kidney, acquired: Secondary | ICD-10-CM | POA: Diagnosis present

## 2021-02-22 ENCOUNTER — Ambulatory Visit: Payer: Medicare HMO | Admitting: Urology

## 2021-02-22 ENCOUNTER — Other Ambulatory Visit: Payer: Self-pay

## 2021-02-22 VITALS — BP 135/70 | HR 82 | Ht 73.0 in | Wt 210.0 lb

## 2021-02-22 DIAGNOSIS — R339 Retention of urine, unspecified: Secondary | ICD-10-CM | POA: Diagnosis not present

## 2021-02-22 DIAGNOSIS — N319 Neuromuscular dysfunction of bladder, unspecified: Secondary | ICD-10-CM | POA: Diagnosis not present

## 2021-02-22 DIAGNOSIS — N281 Cyst of kidney, acquired: Secondary | ICD-10-CM | POA: Diagnosis not present

## 2021-02-22 MED ORDER — OXYBUTYNIN CHLORIDE 5 MG PO TABS: 5.0000 mg | ORAL_TABLET | Freq: Three times a day (TID) | ORAL | 3 refills | Status: DC

## 2021-02-22 MED ORDER — TRIMETHOPRIM 100 MG PO TABS: 100.0000 mg | ORAL_TABLET | Freq: Every day | ORAL | 3 refills | Status: DC

## 2021-02-22 NOTE — Progress Notes (Signed)
02/22/2021 9:52 AM   Hector Simmons 05-12-1941 [id number]  Referring provider: Oswaldo Conroy, MD [address] [place],  [place] [address]  No chief complaint on file.   HPI: The patient had C-spine and lumbar spine surgery in 2010 and I believe had a dural leak and this had a neurogenic bladder since. He catheterizes every 4 hours and rarely leaks. He gets an occasional urinary tract infection.   The patient has a neurogenic bladder clinically. He is very stable and I'm not going to order urodynamics at this stage. He would like to transfer his care here.   Catheterizes 6 or 7 times a day. Little to no sensation in the perineal area. 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   The patient was given a prescription of Detrol and oxybutynin ER 10 mg the see if it works as well as trospium but might be cheaper. If these don't work well he will call back for trospium.   He used to take oxybutynin once in the morning and once at night but in the last 6 months started catheterizing more at night.  Now when he takes 2 at night he can sleep for hours.  He does not take Detrol.  He does not take Flomax.  About 5 times per year he will have to catheterize more frequently and the symptoms settle down when he is treated with ciprofloxacin   Likely the patient has a neurogenic bladder.  I talked to him about trimethoprim suppression therapy and this may also improve some of his nighttime frequency.  He also understands that he would be making more urine at night.  I agree taking oxybutynin 1 in the morning and 2 at night is a good idea (oxy 5 mg).  His last renal ultrasound was approximately 9 months ago.  I would see him in a year with a repeat ultrasound.  His prostate was small and benign and he had significantly decreased anal sphincter tone.     3 times a day  oxybutynin prescribed.  Trimethoprim prescribed.  I will see him in 1 year with an ultrasound.  I do not think he needs to add the [place] but he did ask me about this and I educated him about double therapy.   Today  He was still taking the 3 oxybutynin a day.  Renal ultrasound April 2021 was normal.  Incomplete bladder emptying stable.  On daily trimethoprim and describes no infections.  He is pleased with this.  Not infected today.  Today Continent on the 3 oxybutynin per day.  Normal recent ultrasound with very stable renal cyst.  Infection free on trimethoprim.      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     BP (high blood pressure) 09/17/2013   Last Assessment & Plan:  Hypertension is stable. Continue current treatment regimen. Dietary sodium restriction. Regular aerobic exercise. Continue current medications.    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 Continue current treatment regimen.    CN (  constipation) 01/22/2014   Last Assessment & Plan:  Start PEG powder every day.  incr water (hard w his situation fo self cathing).     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  Last Assessment & Plan:  Continue current exercise regimen to maximize lower extremity strength.  Discussed PT with patient, but patient reports being able to do exercises on his own as taught.      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  Last Assessment & Plan:  Refer to podiatry one  time per year.     Type 2 diabetes mellitus (HCC) 09/28/2015   Overview:  cataract Last Assessment & Plan:  Renal condition is stable. Continue current treatment regimen. Regular aerobic exercise. Continue current medications.     Surgical History: No past surgical history on file.  Home Medications:  Allergies as of 02/22/2021       Reactions   No Known Allergies         Medication List        Accurate as of February 22, 2021  9:52 AM. If you have any questions, ask your nurse or doctor.          amLODipine 5 MG tablet Commonly known as: NORVASC Take 5 mg by mouth daily.   aspirin EC 81 MG tablet Take 81 mg by mouth.   carvedilol 6.25 MG tablet Commonly known as: COREG Take 6.25 mg by mouth.   lisinopril 40 MG tablet Commonly known as: ZESTRIL   lovastatin 40 MG tablet Commonly known as: MEVACOR   mometasone 0.1 % lotion Commonly known as: ELOCON Apply topically daily. Mix in with cerave cream.   omeprazole 20 MG capsule Commonly known as: PRILOSEC   oxybutynin 5 MG tablet Commonly known as: DITROPAN Take 1 tablet (5 mg total) by mouth 3 (three) times daily.   tolterodine 4 MG 24 hr capsule Commonly known as: Detrol LA Take 1 capsule (4 mg total) by mouth daily.   traZODone 50 MG tablet Commonly known as: DESYREL   trimethoprim 100 MG tablet Commonly known as: TRIMPEX Take 1 tablet (100 mg total) by mouth daily.        Allergies:  Allergies  Allergen Reactions   No Known Allergies     Family History: No family history on file.  Social History:  reports that he has quit smoking. He has never used smokeless tobacco. He reports current alcohol use. He reports that he does not use drugs.  ROS:                                        Physical Exam: There were no vitals taken for this visit.  Constitutional:  Alert and oriented, No acute distress. HEENT: [place] AT, moist mucus membranes.  Trachea midline, no  masses. Cardiovascular: No clubbing, cyanosis, or edema.   Laboratory Data: No results found for: WBC, HGB, HCT, MCV, PLT  No results found for: CREATININE  No results found for: PSA  No results found for: TESTOSTERONE  No results found for: HGBA1C  Urinalysis No results found for: COLORURINE, APPEARANCEUR, LABSPEC, PHURINE, GLUCOSEU, HGBUR, BILIRUBINUR, KETONESUR, PROTEINUR, UROBILINOGEN, NITRITE, LEUKOCYTESUR  Pertinent Imaging:   Assessment & Plan:  This patient has  a neurogenic bladder and requires a sterile catheter to be passed 7 times a day for his incomplete bladder emptying.  This and the daily trimethoprim have reduced his bladder infections.  I represcribed his trimethoprim and oxybutynin.  I will see him in 1 year with a renal ultrasound   This patient is 80 years of age and not only has a neurogenic bladder but has an enlarged prostate with benign prostatic hyperplasia.  His enlarged prostate should be managed with a coud catheter and he uses them 7 times daily.  Patient should stay on the trimethoprim and oxybutynin.  See in 1 year with ultrasound  No follow-ups on file.  Martina Sinner, MD  [facility] [address] [place], [place] [address] [phone]

## 2021-05-03 ENCOUNTER — Telehealth: Payer: Self-pay

## 2021-05-03 NOTE — Telephone Encounter (Signed)
Incoming call on triage line from patient in regards to oxybutynin. Patient reports he is taking 2 oxybutynin at night time along with his trimethoprim. This has been working for a while until about 2 weeks ago. Patient reports he is getting up every 2 hours and is not sure what he is doing wrong. He would like to know is there anything that he can do differently, can his dose be increased or are there any other alternatives? Please advise.

## 2021-05-03 NOTE — Progress Notes (Signed)
05/04/2021 11:12 AM   Hector Simmons 10/11/1940 [id number]  Referring provider: Oswaldo Conroy, MD [address] [place],  [place] [address]  Urological history: 1. 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 Bilateral renal cysts -RUS 02/2021 Renal cysts bilaterally with a dominant cyst arising from the upper right kidney measuring 5.5 x 6.0 x 5.4 cm  3. Bladder diverticulum -RUS 02/2021 Suspect small diverticulum arising from the leftward aspect of the urinary bladder  Chief Complaint  Patient presents with   Recurrent UTI    HPI: Hector Simmons is a 80 y.o. male who presents today to rule out UTI per Dr. Sherron Monday.  He had contacted the office yesterday stating that he was continuing his usual 2 oxybutynin at night along with his trimethoprim, but for the last 2 weeks he has been getting up every 2 hours to urinate.  UA greater than 30 WBC's and 3-10 RBC's.      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     BP (high blood pressure) 09/17/2013   Last Assessment & Plan:  Hypertension is stable. Continue current treatment regimen. Dietary sodium restriction. Regular aerobic exercise. Continue current medications.    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 Continue current treatment regimen.    CN (constipation) 01/22/2014   Last Assessment & Plan:  Start PEG powder every day.  incr water (hard w his situation fo self cathing).     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  Last Assessment & Plan:  Continue current exercise regimen to maximize lower extremity strength.  Discussed PT with patient, but patient reports being able to do exercises on his own as taught.      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  Last Assessment & Plan:  Refer to podiatry one time per year.     Type 2 diabetes mellitus (HCC) 09/28/2015   Overview:  cataract Last Assessment & Plan:  Renal condition is stable. Continue current treatment regimen. Regular aerobic exercise. Continue current medications.     Surgical History: No past surgical history on file.  Home Medications:  Allergies as of 05/04/2021       Reactions   No Known Allergies         Medication List        Accurate as of May 04, 2021 11:12 AM. If you have any questions, ask your nurse or doctor.          amLODipine 5 MG tablet Commonly known as: NORVASC   amLODipine 5 MG tablet Commonly known as: NORVASC Take 5 mg by mouth daily.   aspirin EC 81 MG tablet Take 81 mg by mouth.   aspirin 81 MG EC tablet Take by mouth.   lisinopril 40 MG tablet Commonly known as: ZESTRIL Take 1 tablet by mouth daily.   lisinopril 40 MG tablet Commonly known as: ZESTRIL   lovastatin  40 MG tablet Commonly known as: MEVACOR   mometasone 0.1 % lotion Commonly known as: ELOCON Apply topically daily. Mix in with cerave cream.   omeprazole 20 MG capsule Commonly known as: PRILOSEC   oxybutynin 5 MG tablet Commonly known as: DITROPAN Take 1 tablet (5 mg total) by mouth 3 (three) times daily.   traZODone 50 MG tablet Commonly known as: DESYREL   trimethoprim 100 MG tablet Commonly known as: TRIMPEX Take 1 tablet (100 mg total) by mouth daily.        Allergies:  Allergies  Allergen Reactions   No Known Allergies     Family History: No family history on file.  Social History:  reports that  he has quit smoking. He has never used smokeless tobacco. He reports current alcohol use. He reports that he does not use drugs.  ROS: Pertinent ROS in HPI  Physical Exam: BP 112/60   Pulse 99   Ht 6' (1.829 m)   Wt 209 lb (94.8 kg)   BMI 28.35 kg/m   Constitutional:  Well nourished. Alert and oriented, No acute distress. HEENT: [place] AT, mask in place.  Trachea midline Cardiovascular: No clubbing, cyanosis, or edema. Respiratory: Normal respiratory effort, no increased work of breathing. Neurologic: Grossly intact, no focal deficits, moving all 4 extremities. Psychiatric: Normal mood and affect.  Laboratory Data: Urinalysis Component     Latest Ref Rng & Units 05/04/2021  Specific Gravity, UA     1.005 - 1.030 1.015  pH, UA     5.0 - 7.5 6.5  Color, UA     Yellow Yellow  Appearance Ur     Clear Cloudy (A)  Leukocytes,UA     Negative 1+ (A)  Protein,UA     Negative/Trace Negative  Glucose, UA     Negative Negative  Ketones, UA     Negative Negative  RBC, UA     Negative 1+ (A)  Bilirubin, UA     Negative Negative  Urobilinogen, Ur     0.2 - 1.0 mg/dL 0.2  Nitrite, UA     Negative Negative  Microscopic Examination      See below:   Component     Latest Ref Rng & Units 05/04/2021  WBC, UA     0 - 5 /hpf 11-30 (A)  RBC     0 - 2 /hpf 3-10 (A)  Epithelial Cells (non renal)     0 - 10 /hpf 0-10  Bacteria, UA     None seen/Few None seen  I have reviewed the labs.   Pertinent Imaging: N/A  Assessment & Plan:    1. Neurogenic bladder -continue to [facility]  2. Nocturia -encouraged patient to speak with PCP about having another sleep study  -trial of Gemtesa 75 mg at night, # 28 samples in place of the oxybutyin 5 mg IR   Return for pending urine cutlure results .  These notes generated with voice recognition software. I apologize for typographical errors.  Michiel Cowboy, PA-C  [facility] [address]  [address] [place], [place] [address] [phone]

## 2021-05-03 NOTE — Telephone Encounter (Signed)
Patient scheduled w Larene Beach 8/23 to rule out UTI. Patient aware, confirmed appt. Advised patient to be ready to give urine sample upon arrival tomorrow.

## 2021-05-04 ENCOUNTER — Other Ambulatory Visit: Payer: Self-pay

## 2021-05-04 ENCOUNTER — Ambulatory Visit: Payer: Medicare HMO | Admitting: Urology

## 2021-05-04 ENCOUNTER — Encounter: Payer: Self-pay | Admitting: Urology

## 2021-05-04 VITALS — BP 112/60 | HR 99 | Ht 72.0 in | Wt 209.0 lb

## 2021-05-04 DIAGNOSIS — R351 Nocturia: Secondary | ICD-10-CM | POA: Diagnosis not present

## 2021-05-04 DIAGNOSIS — N319 Neuromuscular dysfunction of bladder, unspecified: Secondary | ICD-10-CM

## 2021-05-04 LAB — URINALYSIS, COMPLETE
Bilirubin, UA: NEGATIVE
Glucose, UA: NEGATIVE
Ketones, UA: NEGATIVE
Nitrite, UA: NEGATIVE
Protein,UA: NEGATIVE
Specific Gravity, UA: 1.015 (ref 1.005–1.030)
Urobilinogen, Ur: 0.2 mg/dL (ref 0.2–1.0)
pH, UA: 6.5 (ref 5.0–7.5)

## 2021-05-04 LAB — MICROSCOPIC EXAMINATION: Bacteria, UA: NONE SEEN

## 2021-05-04 MED ORDER — GEMTESA 75 MG PO TABS: 75.0000 mg | ORAL_TABLET | Freq: Every day | ORAL | 0 refills | Status: DC

## 2021-05-07 LAB — CULTURE, URINE COMPREHENSIVE

## 2021-05-11 ENCOUNTER — Other Ambulatory Visit: Payer: Self-pay

## 2021-05-11 ENCOUNTER — Ambulatory Visit: Payer: Medicare HMO | Admitting: Dermatology

## 2021-05-11 DIAGNOSIS — D692 Other nonthrombocytopenic purpura: Secondary | ICD-10-CM | POA: Diagnosis not present

## 2021-05-11 DIAGNOSIS — L300 Nummular dermatitis: Secondary | ICD-10-CM

## 2021-05-11 DIAGNOSIS — L814 Other melanin hyperpigmentation: Secondary | ICD-10-CM

## 2021-05-11 DIAGNOSIS — Z1283 Encounter for screening for malignant neoplasm of skin: Secondary | ICD-10-CM

## 2021-05-11 DIAGNOSIS — L570 Actinic keratosis: Secondary | ICD-10-CM

## 2021-05-11 DIAGNOSIS — L578 Other skin changes due to chronic exposure to nonionizing radiation: Secondary | ICD-10-CM

## 2021-05-11 DIAGNOSIS — L821 Other seborrheic keratosis: Secondary | ICD-10-CM

## 2021-05-11 DIAGNOSIS — D229 Melanocytic nevi, unspecified: Secondary | ICD-10-CM

## 2021-05-11 NOTE — Progress Notes (Signed)
Follow-Up Visit   Subjective  Hector Simmons is a 80 y.o. male who presents for the following: Follow-up (Patient presents for 6 month follow-up Aks and UBSE. He had PDT treatment to the scalp 11/17/20 and 12/22/20 with a good reaction. No history of skin cancer. 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 No history of skin cancer. ).  The following portions of the chart were reviewed this encounter and updated as appropriate:       Review of Systems:  No other skin or systemic complaints except as noted in HPI or Assessment and Plan.  Objective  Well appearing patient in no apparent distress; mood and affect are within normal limits.  All skin waist up examined.  Posterior Scalp x 3, Frontal Scalp x 1, Crown x 1, Glabella x 2 (7) Pink scaly macules.   trunk, extremities Xerosis; rash clear today.   Assessment & Plan  AK (actinic keratosis) (7) Posterior Scalp x 3, Frontal Scalp x 1, Crown x 1, Glabella x 2  Actinic keratoses are precancerous spots that appear secondary to cumulative UV radiation exposure/sun exposure over time. They are chronic with expected duration over 1 year. A portion of actinic keratoses will progress to squamous cell carcinoma of the skin. It is not possible to reliably predict which spots will progress to skin cancer and so treatment is recommended to prevent development of skin cancer.  Recommend daily broad spectrum sunscreen SPF 30+ to sun-exposed areas, reapply every 2 hours as needed.  Recommend staying in the shade or wearing long sleeves, sun glasses (UVA+UVB protection) and wide brim hats (4-inch brim around the entire circumference of the hat). Call for new or changing lesions.  Scalp improved with prior PDT treatments.  Destruction of lesion - Posterior Scalp x 3, Frontal Scalp x 1, Crown x 1, Glabella x 2  Destruction method: cryotherapy   Informed consent: discussed and consent  obtained   Lesion destroyed using liquid nitrogen: Yes   Region frozen until ice ball extended beyond lesion: Yes   Outcome: patient tolerated procedure well with no complications   Post-procedure details: wound care instructions given   Additional details:  Prior to procedure, discussed risks of blister formation, small wound, skin dyspigmentation, or rare scar following cryotherapy. Recommend Vaseline ointment to treated areas while healing.   Nummular dermatitis trunk, extremities  Improved  Recommend mild soap and moisturizing cream 1-2 times daily.  Gentle skin care handout provided.   Continue mometasone solution/CeraVe mix qd/bid to AAs prn flares.   Topical steroids (such as triamcinolone, fluocinolone, fluocinonide, mometasone, clobetasol, halobetasol, betamethasone, hydrocortisone) can cause thinning and lightening of the skin if they are used for too long in the same area. Your physician has selected the right strength medicine for your problem and area affected on the body. Please use your medication only as directed by your physician to prevent side effects.   Skin cancer screening performed today.  Actinic Damage - chronic, secondary to cumulative UV radiation exposure/sun exposure over time - diffuse scaly erythematous macules with underlying dyspigmentation - Recommend daily broad spectrum sunscreen SPF 30+ to sun-exposed areas, reapply every 2 hours as needed.  - Recommend staying in the shade or wearing long sleeves, sun glasses (UVA+UVB protection) and wide brim hats (4-inch brim around the entire circumference of the hat). - Call for new or changing lesions.  Lentigines - Scattered tan macules - Due to sun exposure - Benign-appering, observe - Recommend  daily broad spectrum sunscreen SPF 30+ to sun-exposed areas, reapply every 2 hours as needed. - Call for any changes  Purpura - Chronic; persistent and recurrent.  Treatable, but not curable. - Violaceous  macules and patches - Benign - Related to trauma, age, sun damage and/or use of blood thinners, chronic use of topical and/or oral steroids - Observe - Can use OTC arnica containing moisturizer such as Dermend Bruise Formula if desired - Call for worsening or other concerns  Seborrheic Keratoses - Stuck-on, waxy, tan-brown papules and/or plaques  - Benign-appearing - Discussed benign etiology and prognosis. - Observe - Call for any changes  Melanocytic Nevi - Tan-brown and/or pink-flesh-colored symmetric macules and papules - Benign appearing on exam today - Observation - Call clinic for new or changing moles - Recommend daily use of broad spectrum spf 30+ sunscreen to sun-exposed areas.   Return in about 1 year (around 05/11/2022) for UBSE, AK f/up.  IJamesetta Orleans, CMA, am acting as scribe for Brendolyn Patty, MD .  Documentation: I have reviewed the above documentation for accuracy and completeness, and I agree with the above.  Brendolyn Patty MD

## 2021-05-11 NOTE — Patient Instructions (Addendum)
Cryotherapy Aftercare  Wash gently with soap and water everyday.   Apply Vaseline and Band-Aid daily until healed.    Topical steroids (such as triamcinolone, fluocinolone, fluocinonide, mometasone, clobetasol, halobetasol, betamethasone, hydrocortisone) can cause thinning and lightening of the skin if they are used for too long in the same area. Your physician has selected the right strength medicine for your problem and area affected on the body. Please use your medication only as directed by your physician to prevent side effects.   Dry Skin Care  What causes dry skin?  Dry skin is common and results from inadequate moisture in the outer skin layers. Dry skin usually results from the excessive loss of moisture from the skin surface. This occurs due to two major factors: Normally the skin's oil glands deposit a layer of oil on the skin's surface. This layer of oil prevents the loss of moisture from the skin. Exposure to soaps, cleaners, solvents, and disinfectants removes this oily film, allowing water to escape. Water loss from the skin increases when the humidity is low. During winter months we spend a lot of time indoors where the air is heated. Heated air has very low humidity. This also contributes to dry skin.  A tendency for dry skin may accompany such disorders as eczema. Also, as people age, the number of functioning oil glands decreases, and the tendency toward dry skin can be a sensation of skin tightness when emerging from the shower.  How do I manage dry skin?  Humidify your environment. This can be accomplished by using a humidifier in your bedroom at night during winter months. Bathing can actually put moisture back into your skin if done right. Take the following steps while bathing to sooth dry skin: Avoid hot water, which only dries the skin and makes itching worse. Use warm water. Avoid washcloths or extensive rubbing or scrubbing. Use mild soaps like unscented Dove, Oil  of Olay, Cetaphil, Basis, or CeraVe. If you take baths rather than showers, rinse off soap residue with clean water before getting out of tub. Once out of the shower/tub, pat dry gently with a soft towel. Leave your skin damp. While still damp, apply any medicated ointment/cream you were prescribed to the affected areas. After you apply your medicated ointment/cream, then apply your moisturizer to your whole body.This is the most important step in dry skin care. If this is omitted, your skin will continue to be dry. The choice of moisturizer is also very important. In general, lotion will not provider enough moisture to severely dry skin because it is water based. You should use an ointment or cream. Moisturizers should also be unscented. Good choices include Vaseline (plain petrolatum), Aquaphor, Cetaphil, CeraVe, Vanicream, DML Forte, Aveeno moisture, or Eucerin Cream. Bath oils can be helpful, but do not replace the application of moisturizer after the bath. In addition, they make the tub slippery causing an increased risk for falls. Therefore, we do not recommend their use.  If you have any questions or concerns for your doctor, please call our main line at [phone] and press option 4 to reach your doctor's medical assistant. If no one answers, please leave a voicemail as directed and we will return your call as soon as possible. Messages left after 4 pm will be answered the following business day.   You may also send [place] a message via [place]. We typically respond to MyChart messages within 1-2 business days.  For prescription refills, please ask your pharmacy to contact our  office. Our fax number is [phone].  If you have an urgent issue when the clinic is closed that cannot wait until the next business day, you can page your doctor at the number below.    Please note that while we do our best to be available for urgent issues outside of office hours, we are not available 24/7.   If you  have an urgent issue and are unable to reach [place], you may choose to seek medical care at your doctor's office, retail clinic, urgent care center, or emergency room.  If you have a medical emergency, please immediately call 911 or go to the emergency department.  Pager Numbers  - Dr. Nehemiah Massed: [phone]  - Dr. Laurence Ferrari: [phone]  - Dr. Nicole Kindred: [phone]  In the event of inclement weather, please call our main line at [phone] for an update on the status of any delays or closures.  Dermatology Medication Tips: Please keep the boxes that topical medications come in in order to help keep track of the instructions about where and how to use these. Pharmacies typically print the medication instructions only on the boxes and not directly on the medication tubes.   If your medication is too expensive, please contact our office at [phone] option 4 or send [place] a message through [place].   We are unable to tell what your co-pay for medications will be in advance as this is different depending on your insurance coverage. However, we may be able to find a substitute medication at lower cost or fill out paperwork to get insurance to cover a needed medication.   If a prior authorization is required to get your medication covered by your insurance company, please allow [place] 1-2 business days to complete this process.  Drug prices often vary depending on where the prescription is filled and some pharmacies may offer cheaper prices.  The website www.goodrx.com contains coupons for medications through different pharmacies. The prices here do not account for what the cost may be with help from insurance (it may be cheaper with your insurance), but the website can give you the price if you did not use any insurance.  - You can print the associated coupon and take it with your prescription to the pharmacy.  - You may also stop by our office during regular business hours and pick up a GoodRx coupon  card.  - If you need your prescription sent electronically to a different pharmacy, notify our office through [facility] or by phone at [phone] option 4.

## 2021-08-26 IMAGING — US US RENAL
1 series · 14 of 25 positions shown · non-contrast
Comparison: 12/10/2018

CLINICAL DATA: Follow up of renal cysts.

EXAM:
RENAL / URINARY TRACT ULTRASOUND COMPLETE

[Series 1: us renal · 0.22mm/px · 14 of 47 slices shown]
[im 1/47]
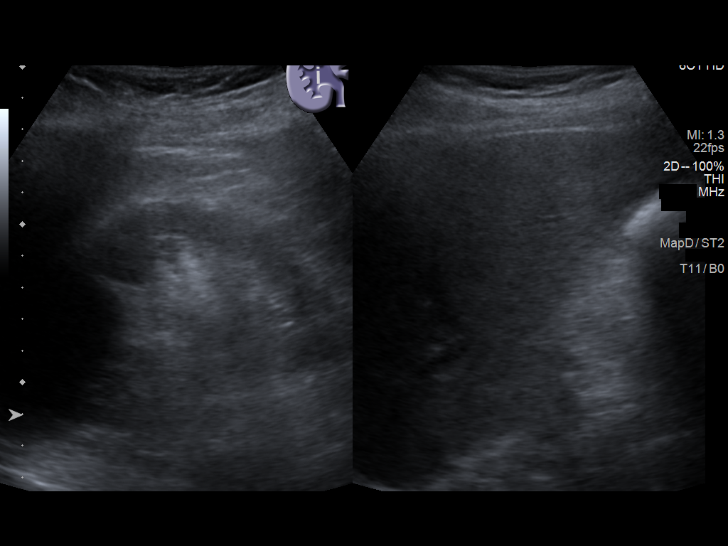
[im 4/47]
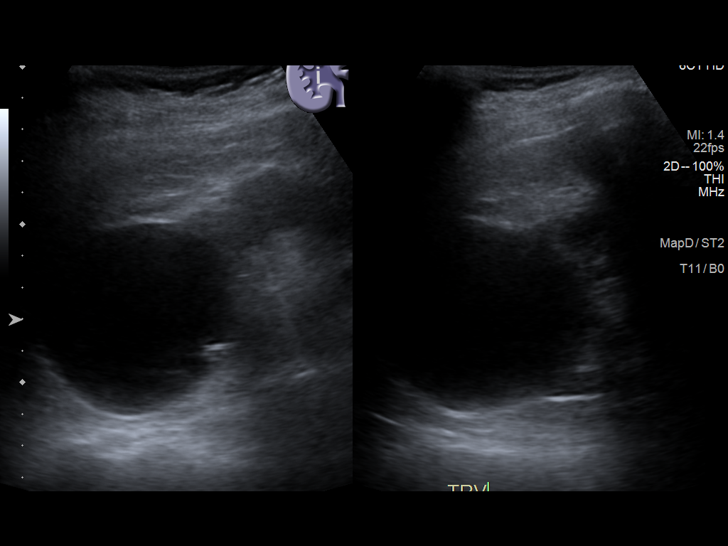
[im 8/47]
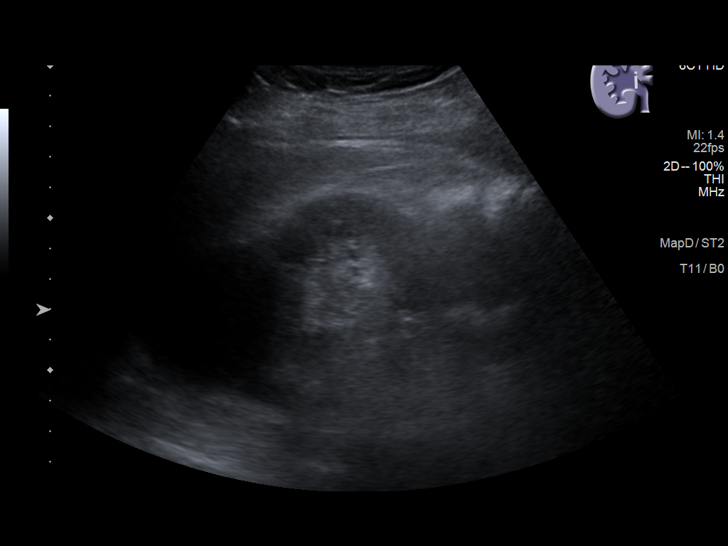
[im 12/47]
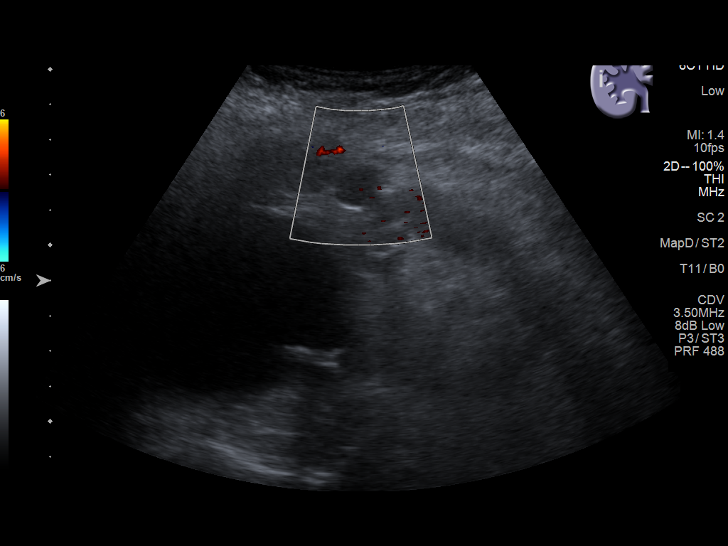
[im 16/47]
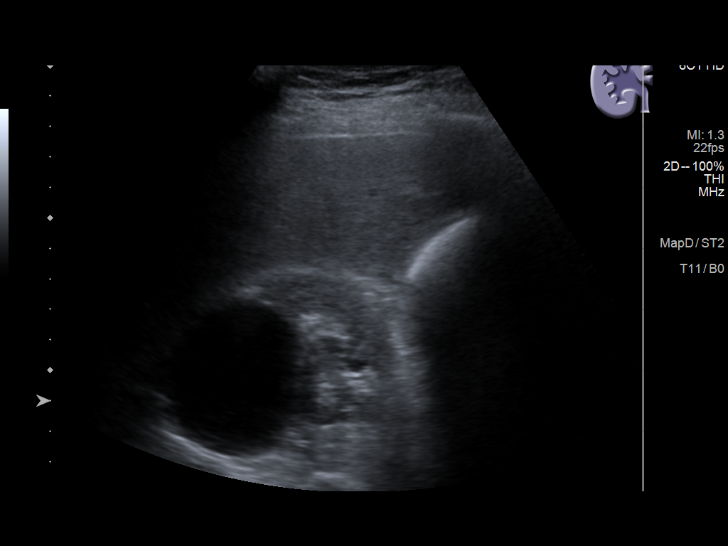
[im 18/47]
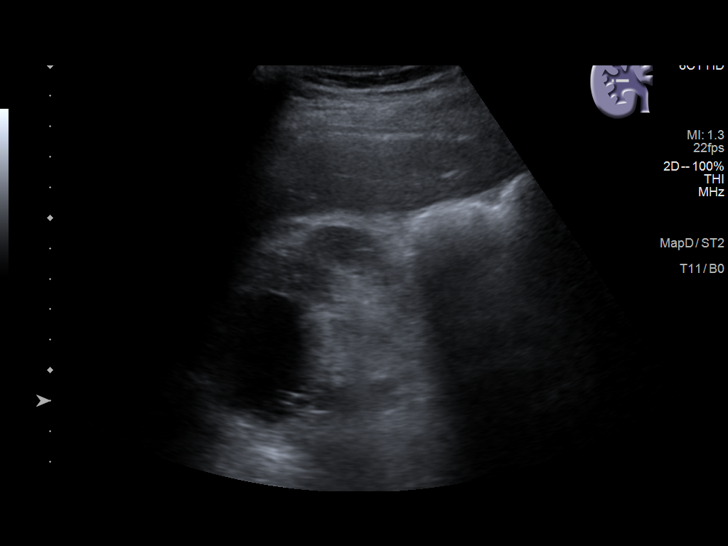
[im 22/47]
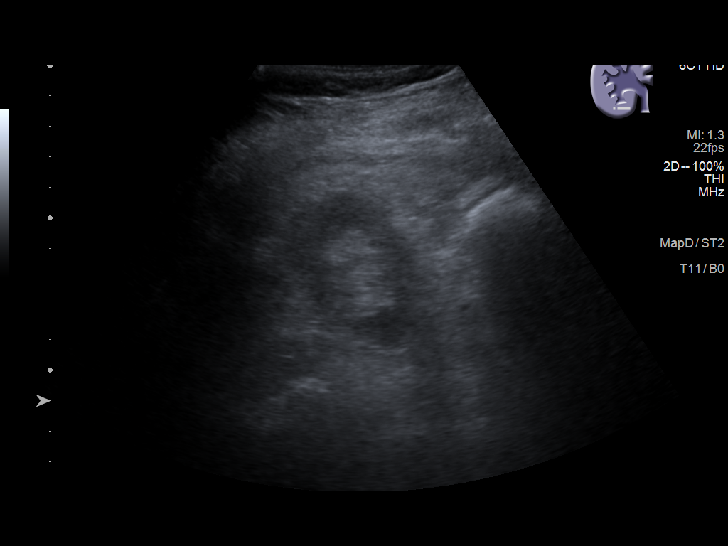
[im 25/47]
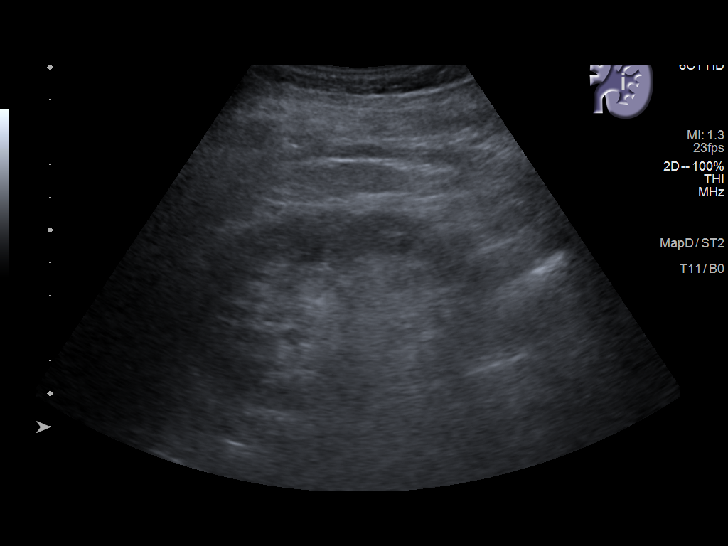
[im 29/47]
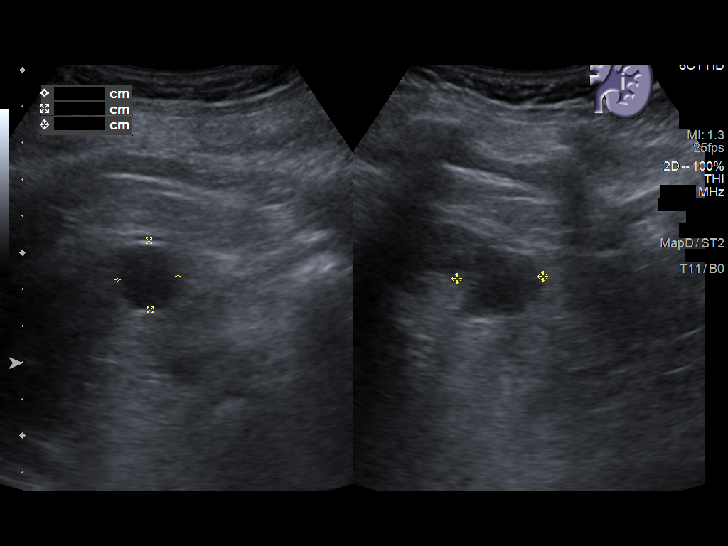
[im 31/47]
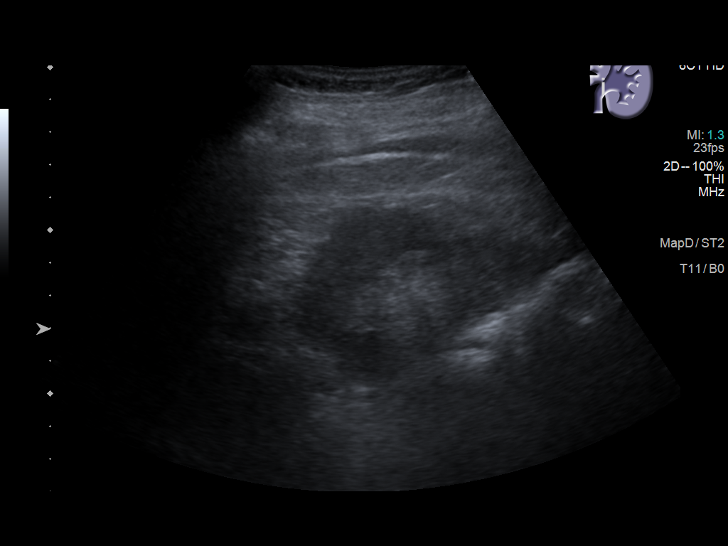
[im 35/47]
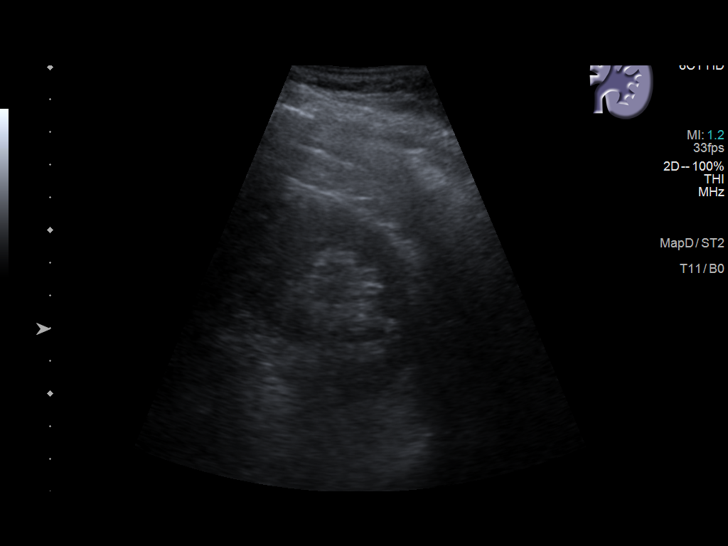
[im 39/47]
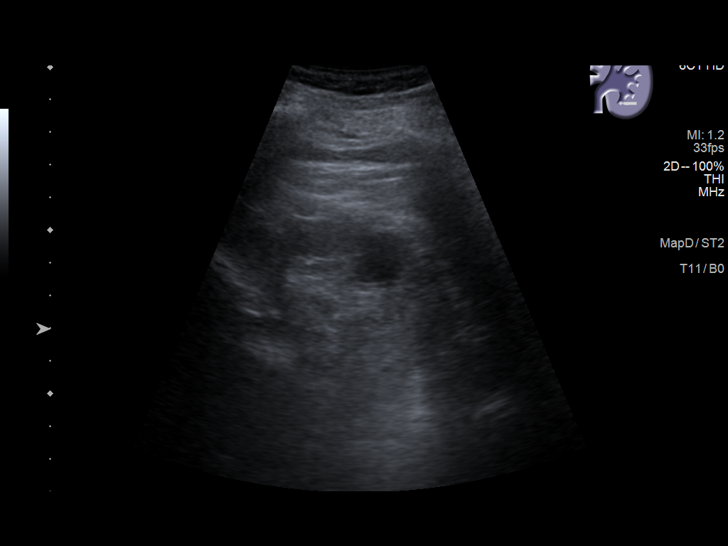
[im 43/47]
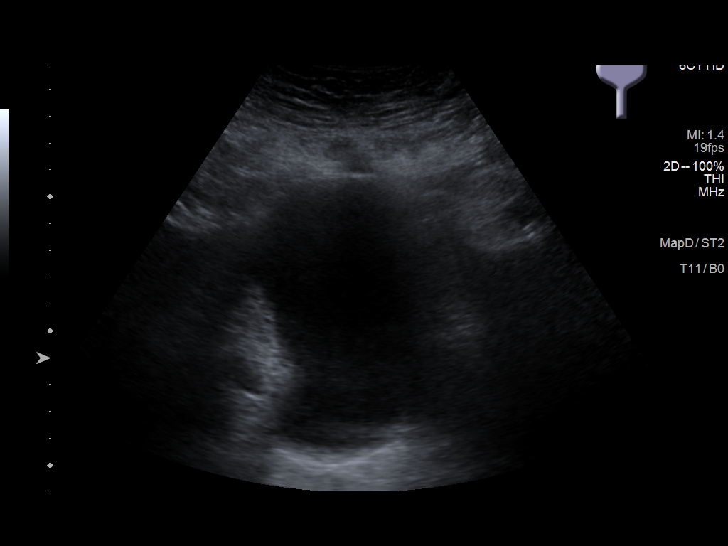
[im 47/47]
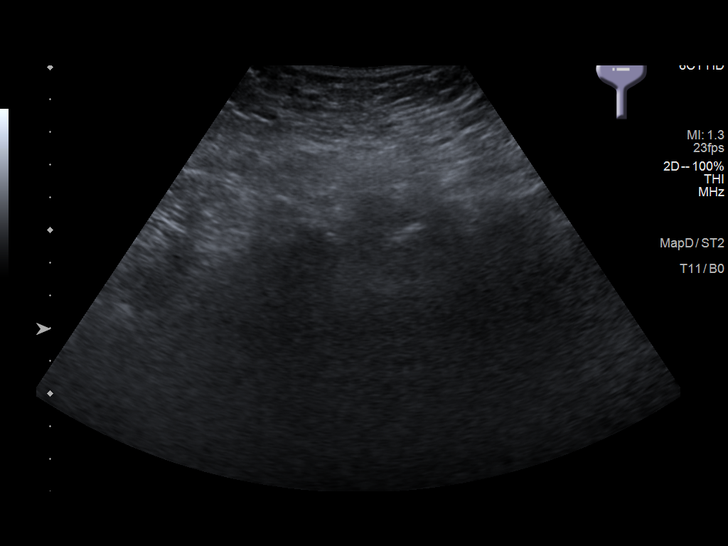

[14 of 25 positions shown; findings below may reference images not displayed]

FINDINGS: Right Kidney:

Renal measurements: 11.2 by 6.6 by 5.9 cm = volume: 231 mL .
Echogenicity within normal limits. No solid mass or hydronephrosis
visualized. 6.9 by 6.2 by 6.2 cm right kidney upper pole cyst,
mildly increased in size from prior. 1.4 by 1.3 by 1.3 cm right mid
kidney cyst.

Left Kidney:

Renal measurements: 9.4 by 6.0 by 4.7 cm = volume: 138 mL.
Echogenicity within normal limits. No mass or hydronephrosis
visualized. 1.7 by 1.9 by 2.4 cm cyst of the left kidney lower pole,
mildly increased in size from prior.

Bladder:

Appears normal for degree of bladder distention.

Other:

None.
IMPRESSION: 1. Bilateral simple appearing renal cysts are again identified. The
dominant right-sided cyst is mildly increased in size from prior,
and these left-sided renal cyst is likewise mildly increased from
prior.

## 2021-10-23 ENCOUNTER — Other Ambulatory Visit: Payer: Self-pay | Admitting: Dermatology

## 2021-12-10 ENCOUNTER — Other Ambulatory Visit: Payer: Self-pay | Admitting: *Deleted

## 2021-12-10 MED ORDER — TRIMETHOPRIM 100 MG PO TABS: 100.0000 mg | ORAL_TABLET | Freq: Every day | ORAL | 3 refills | Status: DC

## 2022-01-03 ENCOUNTER — Telehealth: Payer: Self-pay

## 2022-01-03 DIAGNOSIS — N319 Neuromuscular dysfunction of bladder, unspecified: Secondary | ICD-10-CM

## 2022-01-03 MED ORDER — OXYBUTYNIN CHLORIDE 5 MG PO TABS: 5.0000 mg | ORAL_TABLET | Freq: Three times a day (TID) | ORAL | 3 refills | Status: DC

## 2022-01-03 NOTE — Telephone Encounter (Signed)
Pt calls triage line and states that he needs a refill on oxybutynin. He was previously given samples of Gemtesa, but felt that he was unable to void while taking it so he has resumed Oxybutynin. RX sent for Oxybuytnin, pt advised to keep follow up appt.  ?

## 2022-02-28 ENCOUNTER — Ambulatory Visit: Payer: Medicare HMO | Admitting: Urology

## 2022-02-28 ENCOUNTER — Encounter: Payer: Self-pay | Admitting: Urology

## 2022-02-28 VITALS — BP 132/55 | HR 94 | Ht 72.0 in | Wt 215.0 lb

## 2022-02-28 DIAGNOSIS — N319 Neuromuscular dysfunction of bladder, unspecified: Secondary | ICD-10-CM

## 2022-02-28 DIAGNOSIS — R339 Retention of urine, unspecified: Secondary | ICD-10-CM

## 2022-02-28 MED ORDER — TRIMETHOPRIM 100 MG PO TABS: 100.0000 mg | ORAL_TABLET | Freq: Every day | ORAL | 3 refills | Status: DC

## 2022-02-28 MED ORDER — OXYBUTYNIN CHLORIDE 5 MG PO TABS: 5.0000 mg | ORAL_TABLET | Freq: Three times a day (TID) | ORAL | 3 refills | Status: DC

## 2022-02-28 NOTE — Progress Notes (Signed)
02/28/2022 9:40 AM   Hector Simmons 05-27-1941 [id number]  Referring provider: Oswaldo Conroy, MD [address] [place],  [place] [address]  Chief Complaint  Patient presents with   Follow-up    1 year w/RUS results    HPI: The patient had C-spine and lumbar spine surgery in 2010 and I believe had a dural leak and this had a neurogenic bladder since. He catheterizes every 4 hours and rarely leaks. He gets an occasional urinary tract infection.   The patient has a neurogenic bladder clinically. He is very stable and I'm not going to order urodynamics at this stage. He would like to transfer his care here.   Catheterizes 6 or 7 times a day. Little to no sensation in the perineal area. 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   The patient was given a prescription of Detrol and oxybutynin ER 10 mg the see if it works as well as trospium but might be cheaper. If these don't work well he will call back for trospium.   He used to take oxybutynin once in the morning and once at night but in the last 6 months started catheterizing more at night.  Now when he takes 2 at night he can sleep for hours.  He does not take Detrol.  He does not take Flomax.  About 5 times per year he will have to catheterize more frequently and the symptoms settle down when he is treated with ciprofloxacin   Likely the patient has a neurogenic bladder.  I talked to him about trimethoprim suppression therapy and this may also improve some of his nighttime frequency.  He also understands that he would be making more urine at night.  I agree taking oxybutynin 1 in the morning and 2 at night is a good idea (oxy 5 mg).  His last renal ultrasound was approximately 9 months ago.  I would see him in a year with a repeat ultrasound.  His prostate was small and benign and he had significantly  decreased anal sphincter tone.     3 times a day oxybutynin prescribed.  Trimethoprim prescribed.  I will see him in 1 year with an ultrasound.  I do not think he needs to add the [place] but he did ask me about this and I educated him about double therapy.    Continent on the 3 oxybutynin per day.  Normal recent ultrasound with very stable renal cyst.  Infection free on trimethoprim.   This patient has a neurogenic bladder and requires a sterile catheter to be passed 7 times a day for his incomplete bladder emptying.  This and the daily trimethoprim have reduced his bladder infections.  I represcribed his trimethoprim and oxybutynin.  I will see him in 1 year with a renal ultrasound    Patient should stay on the trimethoprim and oxybutynin.  See in 1 year with ultrasound  Today This patient is 81 years of age and not only has a neurogenic bladder but has an enlarged prostate with benign prostatic hyperplasia.  His enlarged prostate should be managed with a coud catheter and he uses them 7 times daily.  Patient continues to use the catheters as noted.  No infections on trimethoprim.  Takes 2 oxybutynin at night and 1 in the morning.  Has to catheterize more frequently at night likely due to nocturnal  diuresis and this was discussed again.  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     BP (high blood pressure) 09/17/2013   Last Assessment & Plan:  Hypertension is stable. Continue current treatment regimen. Dietary sodium restriction. Regular aerobic exercise. Continue current medications.    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 Continue  current treatment regimen.    CN (constipation) 01/22/2014   Last Assessment & Plan:  Start PEG powder every day.  incr water (hard w his situation fo self cathing).     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  Last Assessment & Plan:  Continue current exercise regimen to maximize lower extremity strength.  Discussed PT with patient, but patient reports being able to do exercises on his own as taught.      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  Last Assessment & Plan:  Refer to podiatry one time per year.     Type 2 diabetes mellitus (HCC) 09/28/2015   Overview:  cataract Last Assessment & Plan:  Renal condition is stable. Continue current treatment regimen. Regular aerobic exercise. Continue current medications.     Surgical History: No past surgical history on file.  Home Medications:  Allergies as of 02/28/2022       Reactions   No Known Allergies         Medication List        Accurate as of February 28, 2022  9:40 AM. If you have any questions, ask your nurse or doctor.          amLODipine 5 MG tablet Commonly known as: NORVASC   amLODipine 5 MG tablet Commonly known as: NORVASC Take 5 mg by mouth daily.   aspirin EC 81 MG tablet Take 81 mg by mouth.   aspirin EC 81 MG tablet Take by mouth.   lisinopril 40 MG tablet Commonly known as: ZESTRIL Take 1 tablet by mouth daily.   lisinopril 40 MG tablet Commonly known as: ZESTRIL   lovastatin 40 MG tablet Commonly known as: MEVACOR   mometasone 0.1 % lotion Commonly known as: ELOCON APPLY TOPICALLY TO AFFECTED AREA(S) EVERY DAY . MIX IN WITH CERAVE CREAM AS DIRECTED   omeprazole 20 MG capsule Commonly known as: PRILOSEC   oxybutynin 5 MG tablet Commonly known as: DITROPAN Take 1 tablet (5 mg  total) by mouth 3 (three) times daily.   traZODone 50 MG tablet Commonly known as: DESYREL   trimethoprim 100 MG tablet Commonly known as: TRIMPEX Take 1 tablet (100 mg total) by mouth daily.        Allergies:  Allergies  Allergen Reactions   No Known Allergies     Family History: No family history on file.  Social History:  reports that he has quit smoking. He has never used smokeless tobacco. He reports current alcohol use. He reports that he does not use drugs.  ROS:  Physical Exam: There were no vitals taken for this visit.  Constitutional:  Alert and oriented, No acute distress. HEENT: [place] AT, moist mucus membranes.  Trachea midline, no masses.   Laboratory Data: No results found for: "WBC", "HGB", "HCT", "MCV", "PLT"  No results found for: "CREATININE"  No results found for: "PSA"  No results found for: "TESTOSTERONE"  No results found for: "HGBA1C"  Urinalysis    Component Value Date/Time   APPEARANCEUR Cloudy (A) 05/04/2021 1031   GLUCOSEU Negative 05/04/2021 1031   BILIRUBINUR Negative 05/04/2021 1031   PROTEINUR Negative 05/04/2021 1031   NITRITE Negative 05/04/2021 1031   LEUKOCYTESUR 1+ (A) 05/04/2021 1031    Pertinent Imaging:   Assessment & Plan: All prescriptions renewed.  Call if renal ultrasound abnormal.  I will see in 1 year.  He has failed all the pills for erectile dysfunction.  I reviewed other options with him and he chose watchful waiting  There are no diagnoses linked to this encounter.  No follow-ups on file.  Martina Sinner, MD  [facility] [address] [place], [place] [address] [phone]

## 2022-03-16 ENCOUNTER — Ambulatory Visit: Payer: Medicare HMO

## 2022-03-16 ENCOUNTER — Ambulatory Visit
Admission: RE | Admit: 2022-03-16 | Discharge: 2022-03-16 | Disposition: A | Discharge status: Home / Self Care | Payer: Medicare HMO | Source: Ambulatory Visit | Attending: Urology | Admitting: Urology

## 2022-03-16 DIAGNOSIS — N319 Neuromuscular dysfunction of bladder, unspecified: Secondary | ICD-10-CM | POA: Insufficient documentation

## 2022-05-16 ENCOUNTER — Ambulatory Visit: Payer: Medicare HMO | Admitting: Dermatology

## 2022-05-17 ENCOUNTER — Ambulatory Visit: Payer: Medicare HMO | Admitting: Dermatology

## 2022-05-17 DIAGNOSIS — L814 Other melanin hyperpigmentation: Secondary | ICD-10-CM

## 2022-05-17 DIAGNOSIS — L821 Other seborrheic keratosis: Secondary | ICD-10-CM

## 2022-05-17 DIAGNOSIS — L300 Nummular dermatitis: Secondary | ICD-10-CM

## 2022-05-17 DIAGNOSIS — D225 Melanocytic nevi of trunk: Secondary | ICD-10-CM

## 2022-05-17 DIAGNOSIS — Z1283 Encounter for screening for malignant neoplasm of skin: Secondary | ICD-10-CM | POA: Diagnosis not present

## 2022-05-17 DIAGNOSIS — D692 Other nonthrombocytopenic purpura: Secondary | ICD-10-CM

## 2022-05-17 DIAGNOSIS — Z872 Personal history of diseases of the skin and subcutaneous tissue: Secondary | ICD-10-CM

## 2022-05-17 DIAGNOSIS — D1801 Hemangioma of skin and subcutaneous tissue: Secondary | ICD-10-CM

## 2022-05-17 NOTE — Patient Instructions (Addendum)
Recommend daily broad spectrum sunscreen SPF 30+ to sun-exposed areas, reapply every 2 hours as needed. Call for new or changing lesions.  Staying in the shade or wearing long sleeves, sun glasses (UVA+UVB protection) and wide brim hats (4-inch brim around the entire circumference of the hat) are also recommended for sun protection.   For Arms Sunscreen in the morning Continue Dermend to arms Stop Mometasone/Cerave to arms  Gentle Skin Care Guide  1. Bathe no more than once a day.  2. Avoid bathing in hot water  3. Use a mild soap like Dove, Vanicream, Cetaphil, CeraVe. Can use Lever 2000 or Cetaphil antibacterial soap  4. Use soap only where you need it. On most days, use it under your arms, between your legs, and on your feet. Let the water rinse other areas unless visibly dirty.  5. When you get out of the bath/shower, use a towel to gently blot your skin dry, don't rub it.  6. While your skin is still a little damp, apply a moisturizing cream such as Vanicream, CeraVe, Cetaphil, Eucerin, Sarna lotion or plain Vaseline Jelly. For hands apply Neutrogena [place] Hand Cream or Excipial Hand Cream.  7. Reapply moisturizer any time you start to itch or feel dry.  8. Sometimes using free and clear laundry detergents can be helpful. Fabric softener sheets should be avoided. Downy Free & Gentle liquid, or any liquid fabric softener that is free of dyes and perfumes, it acceptable to use  9. If your doctor has given you prescription creams you may apply moisturizers over them   Seborrheic Keratosis  What causes seborrheic keratoses? Seborrheic keratoses are harmless, common skin growths that first appear during adult life.  As time goes by, more growths appear.  Some people may develop a large number of them.  Seborrheic keratoses appear on both covered and uncovered body parts.  They are not caused by sunlight.  The tendency to develop seborrheic keratoses can be inherited.  They vary in  color from skin-colored to gray, brown, or even black.  They can be either smooth or have a rough, warty surface.   Seborrheic keratoses are superficial and look as if they were stuck on the skin.  Under the microscope this type of keratosis looks like layers upon layers of skin.  That is why at times the top layer may seem to fall off, but the rest of the growth remains and re-grows.    Treatment Seborrheic keratoses do not need to be treated, but can easily be removed in the office.  Seborrheic keratoses often cause symptoms when they rub on clothing or jewelry.  Lesions can be in the way of shaving.  If they become inflamed, they can cause itching, soreness, or burning.  Removal of a seborrheic keratosis can be accomplished by freezing, burning, or surgery. If any spot bleeds, scabs, or grows rapidly, please return to have it checked, as these can be an indication of a skin cancer.  Due to recent changes in healthcare laws, you may see results of your pathology and/or laboratory studies on MyChart before the doctors have had a chance to review them. We understand that in some cases there may be results that are confusing or concerning to you. Please understand that not all results are received at the same time and often the doctors may need to interpret multiple results in order to provide you with the best plan of care or course of treatment. Therefore, we ask that you please give  [place] 2 business days to thoroughly review all your results before contacting the office for clarification. Should we see a critical lab result, you will be contacted sooner.   If You Need Anything After Your Visit  If you have any questions or concerns for your doctor, please call our main line at [phone] and press option 4 to reach your doctor's medical assistant. If no one answers, please leave a voicemail as directed and we will return your call as soon as possible. Messages left after 4 pm will be answered the  following business day.   You may also send [place] a message via [place]. We typically respond to MyChart messages within 1-2 business days.  For prescription refills, please ask your pharmacy to contact our office. Our fax number is [phone].  If you have an urgent issue when the clinic is closed that cannot wait until the next business day, you can page your doctor at the number below.    Please note that while we do our best to be available for urgent issues outside of office hours, we are not available 24/7.   If you have an urgent issue and are unable to reach [place], you may choose to seek medical care at your doctor's office, retail clinic, urgent care center, or emergency room.  If you have a medical emergency, please immediately call 911 or go to the emergency department.  Pager Numbers  - Dr. Nehemiah Massed: [phone]  - Dr. Laurence Ferrari: [phone]  - Dr. Nicole Kindred: [phone]  In the event of inclement weather, please call our main line at [phone] for an update on the status of any delays or closures.  Dermatology Medication Tips: Please keep the boxes that topical medications come in in order to help keep track of the instructions about where and how to use these. Pharmacies typically print the medication instructions only on the boxes and not directly on the medication tubes.   If your medication is too expensive, please contact our office at [phone] option 4 or send [place] a message through [place].   We are unable to tell what your co-pay for medications will be in advance as this is different depending on your insurance coverage. However, we may be able to find a substitute medication at lower cost or fill out paperwork to get insurance to cover a needed medication.   If a prior authorization is required to get your medication covered by your insurance company, please allow [place] 1-2 business days to complete this process.  Drug prices often vary depending on where the  prescription is filled and some pharmacies may offer cheaper prices.  The website www.goodrx.com contains coupons for medications through different pharmacies. The prices here do not account for what the cost may be with help from insurance (it may be cheaper with your insurance), but the website can give you the price if you did not use any insurance.  - You can print the associated coupon and take it with your prescription to the pharmacy.  - You may also stop by our office during regular business hours and pick up a GoodRx coupon card.  - If you need your prescription sent electronically to a different pharmacy, notify our office through [facility] or by phone at [phone] option 4.     Si Usted Necesita Algo Despus de Su Visita  Tambin puede enviarnos un mensaje a travs de Pharmacist, community. Por lo general respondemos a los mensajes de MyChart en el transcurso de 1 a  2 das hbiles.  Para renovar recetas, por favor pida a su farmacia que se ponga en contacto con nuestra oficina. Harland Dingwall de fax es [place] [phone].  Si tiene un asunto urgente cuando la clnica est cerrada y que no puede esperar hasta el siguiente da hbil, puede llamar/localizar a su doctor(a) al nmero que aparece a continuacin.   Por favor, tenga en cuenta que aunque hacemos todo lo posible para estar disponibles para asuntos urgentes fuera del horario de [place], no estamos disponibles las 24 horas del da, los 7 das de la [place].   Si tiene un problema urgente y no puede comunicarse con nosotros, puede optar por buscar atencin mdica  en el consultorio de su doctor(a), en una clnica privada, en un centro de atencin urgente o en una sala de emergencias.  Si tiene Engineering geologist, por favor llame inmediatamente al 911 o vaya a la sala de emergencias.  Nmeros de bper  - Dr. Nehemiah Massed: [phone]  - Dra. Moye: [phone]  - Dra. Nicole Kindred: [phone]  En caso de inclemencias del [place],  por favor llame a Johnsie Kindred principal al [phone] para una actualizacin sobre el [place] de cualquier retraso o cierre.  Consejos para la medicacin en dermatologa: Por favor, guarde las cajas en las que vienen los medicamentos de uso tpico para ayudarle a seguir las instrucciones sobre dnde y cmo usarlos. Las farmacias generalmente imprimen las instrucciones del medicamento slo en las cajas y no directamente en los tubos del [place].   Si su medicamento es muy caro, por favor, pngase en contacto con Zigmund Daniel llamando al [phone] y presione la opcin 4 o envenos un mensaje a travs de Pharmacist, community.   No podemos decirle cul ser su copago por los medicamentos por adelantado ya que esto es diferente dependiendo de la cobertura de su seguro. Sin embargo, es posible que podamos encontrar un medicamento sustituto a Electrical engineer un formulario para que el seguro cubra el medicamento que se considera necesario.   Si se requiere una autorizacin previa para que su compaa de seguros [place] su medicamento, por favor permtanos de 1 a 2 das hbiles para completar este proceso.  Los precios de los medicamentos varan con frecuencia dependiendo del Environmental consultant de dnde se surte la receta y alguna farmacias pueden ofrecer precios ms baratos.  El sitio web www.goodrx.com tiene cupones para medicamentos de Airline pilot. Los precios aqu no tienen en cuenta lo que podra costar con la ayuda del seguro (puede ser ms barato con su seguro), pero el sitio web puede darle el precio si no utiliz Research scientist (physical sciences).  - Puede imprimir el cupn correspondiente y llevarlo con su receta a la farmacia.  - Tambin puede pasar por nuestra oficina durante el horario de atencin regular y Charity fundraiser una tarjeta de cupones de GoodRx.  - Si necesita que su receta se enve electrnicamente a una farmacia diferente, informe a nuestra oficina a travs de MyChart de [place] o por telfono llamando al  [phone] y presione la opcin 4.

## 2022-05-17 NOTE — Progress Notes (Signed)
Follow-Up Visit   Subjective  Hector Simmons is a 81 y.o. male who presents for the following: Upper body skin exam (Hx of AKs) and Bleeding/Bruising (arms).  The patient presents for Upper Body Skin Exam (UBSE) for skin cancer screening and mole check.  The patient has spots, moles and lesions to be evaluated, some may be new or changing and the patient has concerns that these could be cancer.   The following portions of the chart were reviewed this encounter and updated as appropriate:       Review of Systems:  No other skin or systemic complaints except as noted in HPI or Assessment and Plan.  Objective  Well appearing patient in no apparent distress; mood and affect are within normal limits.  All skin waist up examined.  arms, trunk Trunk, arms clear    Assessment & Plan   Lentigines - Scattered tan macules - Due to sun exposure - Benign-appearing, observe - Recommend daily broad spectrum sunscreen SPF 30+ to sun-exposed areas, reapply every 2 hours as needed. - Call for any changes - back, scalp  Seborrheic Keratoses - Stuck-on, waxy, tan-brown papules and/or plaques  - Benign-appearing - Discussed benign etiology and prognosis. - Observe - Call for any changes - back  Melanocytic Nevi - Tan-brown and/or pink-flesh-colored symmetric macules and papules - Benign appearing on exam today - Observation - Call clinic for new or changing moles - Recommend daily use of broad spectrum spf 30+ sunscreen to sun-exposed areas.  - R post base of neck  Hemangiomas - Red papules - Discussed benign nature - Observe - Call for any changes - trunk  Actinic Damage - Chronic condition, secondary to cumulative UV/sun exposure - diffuse scaly erythematous macules with underlying dyspigmentation - Recommend daily broad spectrum sunscreen SPF 30+ to sun-exposed areas, reapply every 2 hours as needed.  - Staying in the shade or wearing long sleeves, sun glasses  (UVA+UVB protection) and wide brim hats (4-inch brim around the entire circumference of the hat) are also recommended for sun protection.  - Call for new or changing lesions. - scalp  Skin cancer screening performed today.  Nummular dermatitis arms, trunk  Chronic condition with duration or expected duration over one year. Currently well-controlled.   Atopic dermatitis (eczema) is a chronic, relapsing, pruritic condition that can significantly affect quality of life. It is often associated with allergic rhinitis and/or asthma and can require treatment with topical medications, phototherapy, or in severe cases biologic injectable medication (Dupixent; Adbry) or Oral JAK inhibitors.   Cont Mometasone/Cerave cr qd up to 5d/wk aa trunk prn flares Recommend d/c Mometasone/Cerave to arms if not itching due to risk skin thinning and problems with bruising, can use plain Cerave to arms  Topical steroids (such as triamcinolone, fluocinolone, fluocinonide, mometasone, clobetasol, halobetasol, betamethasone, hydrocortisone) can cause thinning and lightening of the skin if they are used for too long in the same area. Your physician has selected the right strength medicine for your problem and area affected on the body. Please use your medication only as directed by your physician to prevent side effects.     Purpura - Chronic; persistent and recurrent.  Treatable, but not curable. - Violaceous macules and patches BL forearms - Benign - Related to trauma, age, sun damage and/or use of blood thinners, chronic use of topical and/or oral steroids - Observe - Can use OTC arnica containing moisturizer such as Dermend Bruise Formula if desired - Call for worsening or other concerns  History of PreCancerous Actinic Keratosis  - site(s) of PreCancerous Actinic Keratosis clear today. - these may recur and new lesions may form requiring treatment to prevent transformation into skin cancer - observe for new or  changing spots and contact [place] for appointment if occur - photoprotection with sun protective clothing; sunglasses and broad spectrum sunscreen with SPF of at least 30 + and frequent self skin exams recommended - yearly exams by a dermatologist recommended for persons with history of PreCancerous Actinic Keratoses   Return in about 1 year (around 05/18/2023) for UBSE, Hx of AKs.  I, Othelia Pulling, RMA, am acting as scribe for Brendolyn Patty, MD .  Documentation: I have reviewed the above documentation for accuracy and completeness, and I agree with the above.  Brendolyn Patty MD

## 2022-10-31 ENCOUNTER — Other Ambulatory Visit: Payer: Self-pay | Admitting: Dermatology

## 2022-10-31 IMAGING — US US RENAL
1 series · 13 of 25 positions shown · non-contrast
Comparison: December 12, 2019

CLINICAL DATA: Renal cysts

EXAM:
RENAL / URINARY TRACT ULTRASOUND COMPLETE

[Series 1: us renal · 0.28mm/px · 13 of 46 slices shown]
[im 1/46]
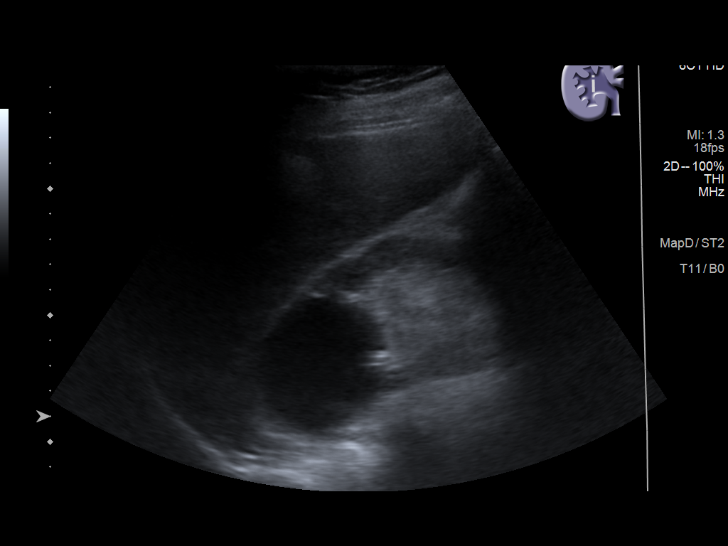
[im 4/46]
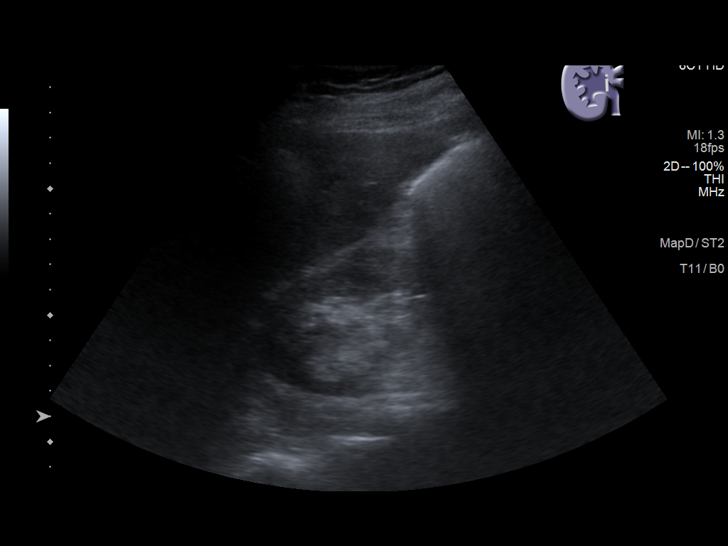
[im 8/46]
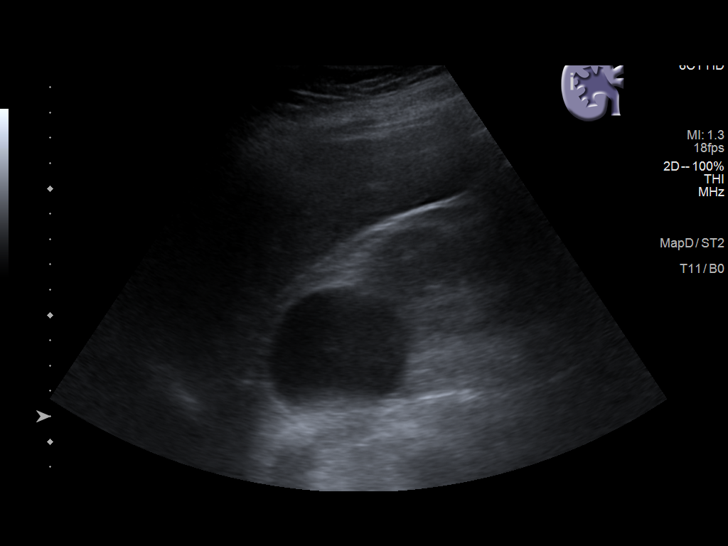
[im 12/46]
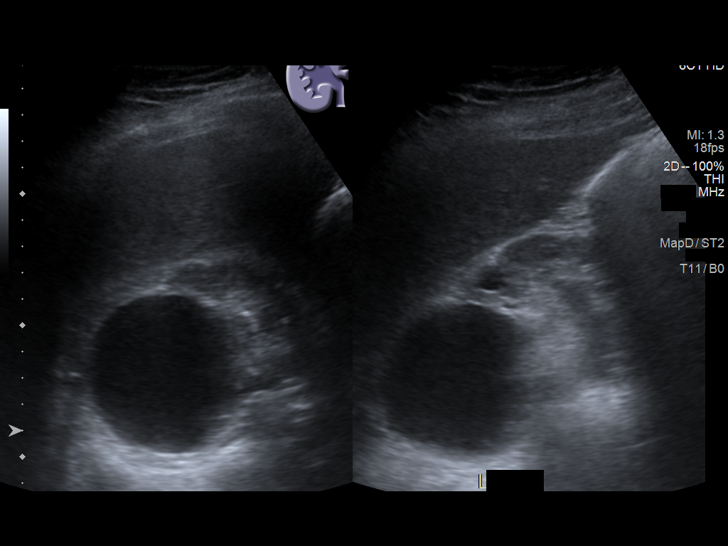
[im 16/46]
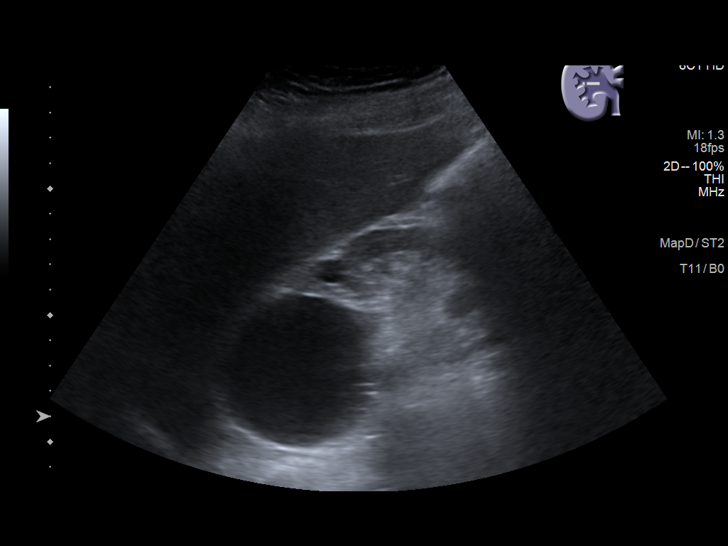
[im 19/46]
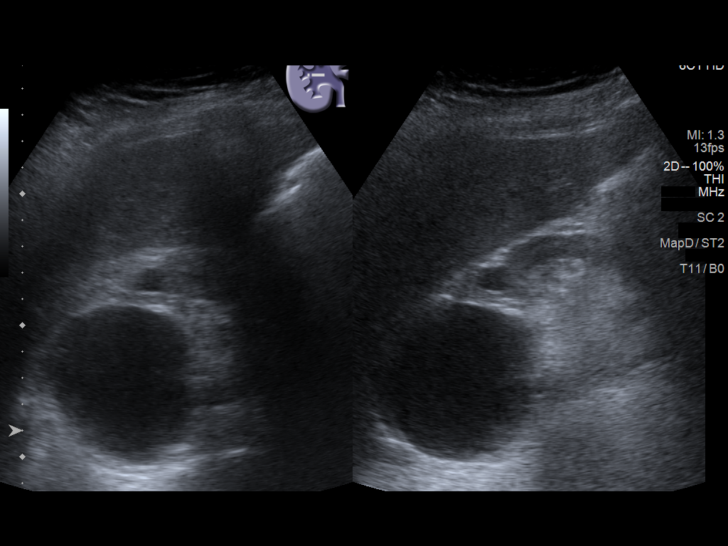
[im 23/46]
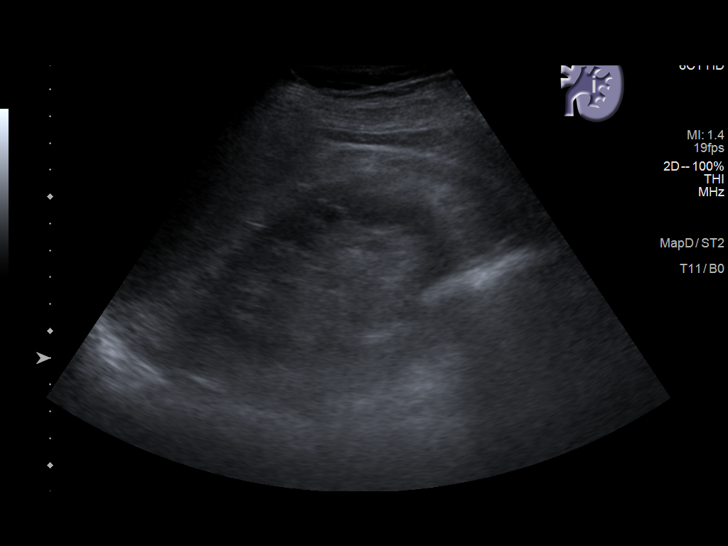
[im 27/46]
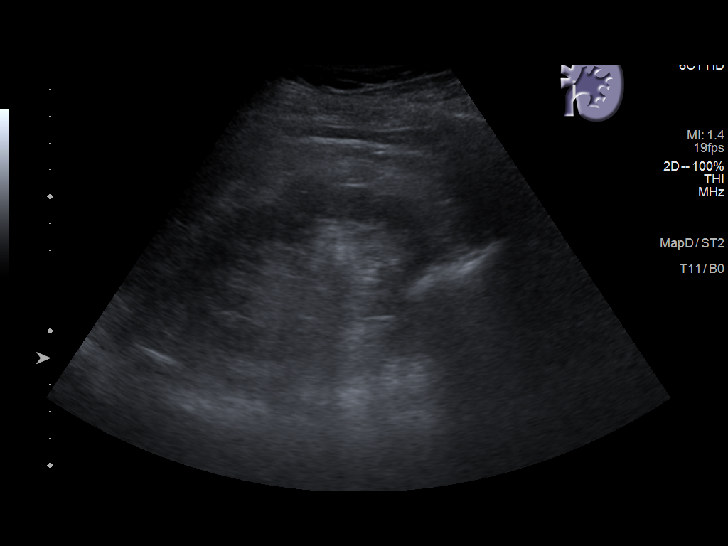
[im 31/46]
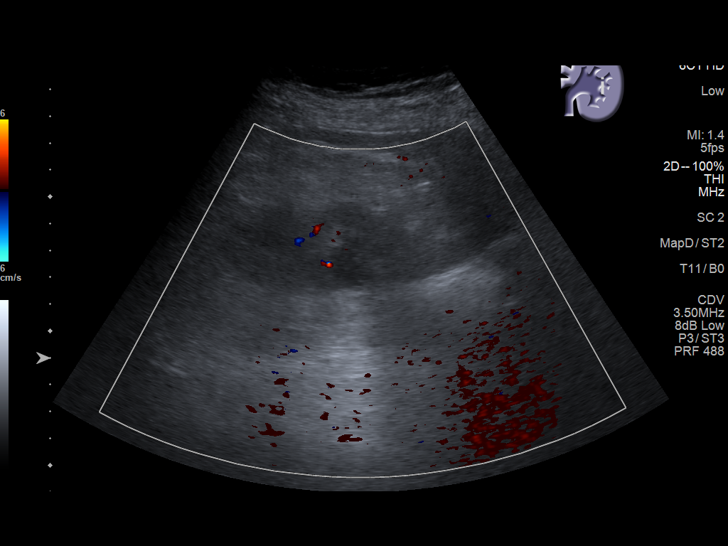
[im 34/46]
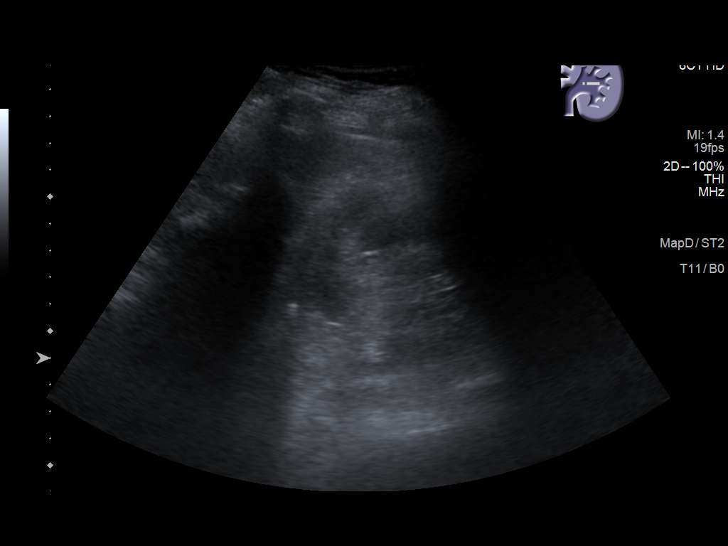
[im 38/46]
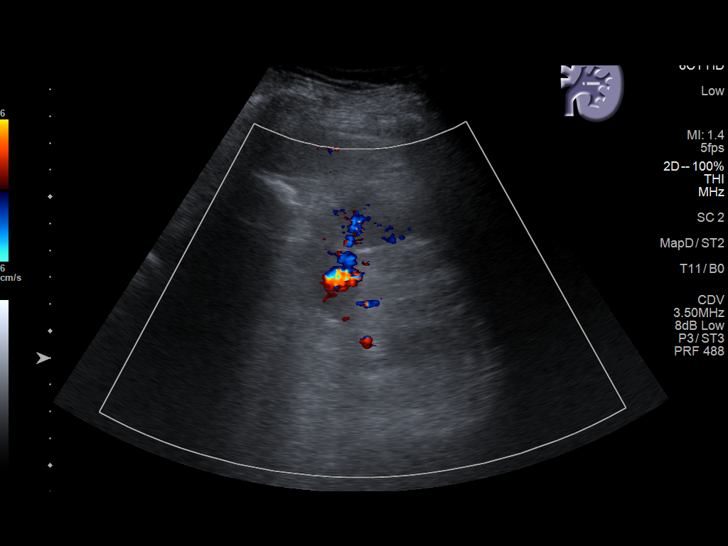
[im 42/46]
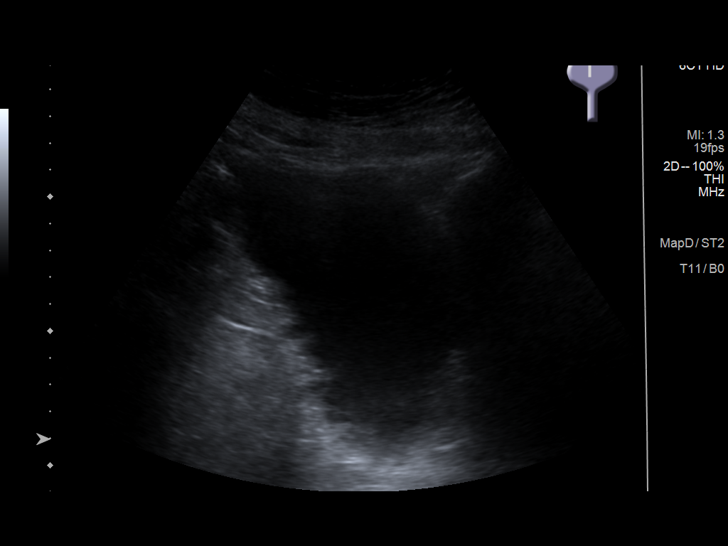
[im 46/46]
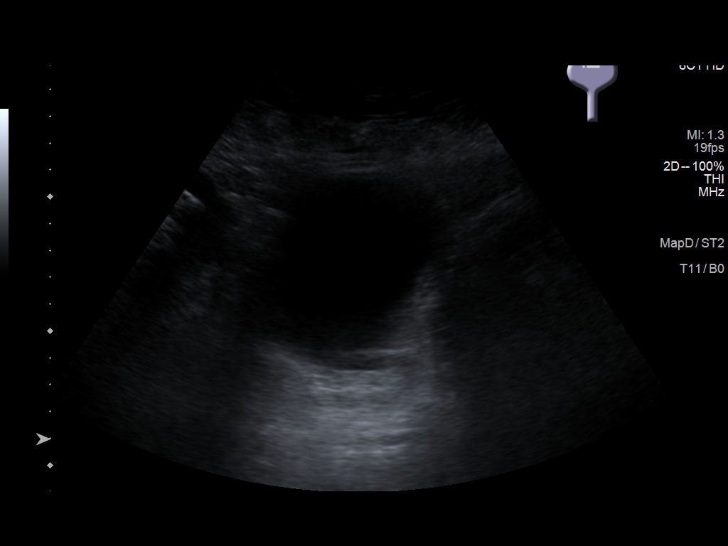

[13 of 25 positions shown; findings below may reference images not displayed]

FINDINGS: Right Kidney:

Renal measurements: 12.4 x 5.9 x 5.9 cm = volume: 227 mL.
Echogenicity and renal cortical thickness are within normal limits.
No perinephric fluid or hydronephrosis visualized. There is a
dominant cyst arising from the upper pole of the right kidney
measuring 5.5 x 6.0 x 5.4 cm. A cyst nearby in the upper to mid
kidney on the right measures 0.9 x 1.2 x 0.9 cm. No noncystic renal
masses evident on the right. No sonographically demonstrable
calculus or ureterectasis.

Left Kidney:

Renal measurements: 10.0 x 7.3 x 6.6 cm = volume: 249 mL.
Echogenicity and renal cortical thickness are within normal limits.
No perinephric fluid or hydronephrosis visualized. There is a cyst
arising from the lower pole left kidney measuring 1.5 x 1.8 x
cm. No noncystic mass seen on the left. No sonographically
demonstrable calculus or ureterectasis.

Bladder:

Note urinary bladder wall thickening is evident. Suspected
diverticulum arising from the leftward aspect of the bladder
measuring 1.4 x 1.3 cm.

Other:

None.
IMPRESSION: Renal cysts bilaterally with a dominant cyst arising from the upper
right kidney measuring 5.5 x 6.0 x 5.4 cm. This dominant cyst was
also present previously. No noncystic renal masses appreciable on
either side. No obstructing focus in either kidney.

Suspect small diverticulum arising from the leftward aspect of the
urinary bladder. Urinary bladder otherwise appears unremarkable.

## 2022-12-13 ENCOUNTER — Ambulatory Visit: Payer: Self-pay | Admitting: *Deleted

## 2022-12-13 NOTE — Telephone Encounter (Signed)
  Chief Complaint: Vomiting Symptoms: Reports one episode of vomiting and diarrhea "At the same time" yesterday am. No other symptoms, no additional episodes. Frequency: Yesterday Pertinent Negatives: Patient denies abdominal pain, fever, reflux, headache Disposition: [] ED /[] Urgent Care (no appt availability in office) / [] Appointment(In office/virtual)/ []  [facility]/ [x] Home Care/ [] Refused Recommended Disposition /[] [facility]/ [x]  Follow-up with PCP Additional Notes: Community line call.  Pt states "Feel fine today, just never happened to me before."  Advised to alert PCP in am. Home care advise given, pt verbalizes understanding. Reason for Disposition  MILD vomiting with diarrhea  Answer Assessment - Initial Assessment Questions 1. VOMITING SEVERITY: "How many times have you vomited in the past 24 hours?"     - MILD:  1 - 2 times/day    - MODERATE: 3 - 5 times/day, decreased oral intake without significant weight loss or symptoms of dehydration    - SEVERE: 6 or more times/day, vomits everything or nearly everything, with significant weight loss, symptoms of dehydration      1 episode yesterday 2. ONSET: "When did the vomiting begin?"      Yesterday 3. FLUIDS: "What fluids or food have you vomited up today?" "Have you been able to keep any fluids down?"     None today 4. ABDOMEN PAIN: "Are your having any abdomen pain?" If Yes : "How bad is it and what does it feel like?" (e.g., crampy, dull, intermittent, constant)      no 5. DIARRHEA: "Is there any diarrhea?" If Yes, ask: "How many times today?"      Yes yesterday, none today 6. CONTACTS: "Is there anyone else in the family with the same symptoms?"      No 7. CAUSE: "What do you think is causing your vomiting?"     Unsure 8. HYDRATION STATUS: "Any signs of dehydration?" (e.g., dry mouth [not only dry lips], too weak to stand) "When did you last urinate?"     No 9. OTHER SYMPTOMS: "Do you have any other  symptoms?" (e.g., fever, headache, vertigo, vomiting blood or coffee grounds, recent head injury)     None  Protocols used: Vomiting-A-AH

## 2023-02-27 ENCOUNTER — Ambulatory Visit: Payer: Medicare HMO | Admitting: Urology

## 2023-02-27 VITALS — BP 129/56 | HR 76 | Ht 72.0 in | Wt 212.0 lb

## 2023-02-27 DIAGNOSIS — N319 Neuromuscular dysfunction of bladder, unspecified: Secondary | ICD-10-CM

## 2023-02-27 LAB — URINALYSIS, COMPLETE
Bilirubin, UA: NEGATIVE
Glucose, UA: NEGATIVE
Ketones, UA: NEGATIVE
Nitrite, UA: NEGATIVE
Protein,UA: NEGATIVE
Specific Gravity, UA: 1.015 (ref 1.005–1.030)
Urobilinogen, Ur: 0.2 mg/dL (ref 0.2–1.0)
pH, UA: 5.5 (ref 5.0–7.5)

## 2023-02-27 LAB — MICROSCOPIC EXAMINATION: WBC, UA: >30 /hpf — AB (ref 0–5)

## 2023-02-27 MED ORDER — OXYBUTYNIN CHLORIDE 5 MG PO TABS
5.0000 mg | ORAL_TABLET | Freq: Three times a day (TID) | ORAL | 3 refills | Status: AC
Start: 2023-02-27 — End: ?

## 2023-02-27 NOTE — Progress Notes (Signed)
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  His enlarged prostate should be managed with a coud catheter and he uses them 7 times daily.   Patient continues to use the catheters as noted.  No infections on trimethoprim.  Takes 2 oxybutynin at night and 1 in the morning.  Has to catheterize more frequently at night likely due to nocturnal diuresis and this was discussed again.  Clinically not infected  ll prescriptions renewed.  Call if renal ultrasound abnormal.  I will see in 1 year.  He has failed all the pills for erectile dysfunction.  I reviewed other options with him and he chose watchful waiting   TOday Incomplete bladder emptying stable.  Ultrasound from 1 year ago demonstrated right-sided hydronephrosis that resolved after voiding.  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 No infection on trimethoprim.  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     BP (high blood pressure) 09/17/2013   Last Assessment & Plan:  Hypertension is stable. Continue current treatment regimen. Dietary sodium restriction. Regular aerobic exercise. Continue current medications.    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 Continue current treatment regimen.    CN (constipation)  01/22/2014   Last Assessment & Plan:  Start PEG powder every day.  incr water (hard w his situation fo self cathing).     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  Last Assessment & Plan:  Continue current exercise regimen to maximize lower extremity strength.  Discussed PT with patient, but patient reports being able to do exercises on his own as taught.      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  Last Assessment & Plan:  Refer to podiatry one time per year.     Type 2 diabetes mellitus (HCC) 09/28/2015   Overview:  cataract Last Assessment & Plan:  Renal condition is stable. Continue current treatment regimen. Regular aerobic exercise. Continue current medications.     Surgical History: No past surgical history on file.  Home Medications:  Allergies as of 02/27/2023       Reactions   No Known Allergies         Medication List        Accurate as of February 27, 2023  9:35 AM. If you have any questions, ask your nurse or doctor.          amLODipine 5 MG tablet Commonly known as: NORVASC   aspirin EC 81 MG tablet Take 81 mg by mouth.   lactulose 10 GM/15ML solution Commonly known  as: CHRONULAC SMARTSIG:Milliliter(s) By Mouth   lisinopril 40 MG tablet Commonly known as: ZESTRIL Take 1 tablet by mouth daily.   lovastatin 40 MG tablet Commonly known as: MEVACOR   mometasone 0.1 % lotion Commonly known as: ELOCON APPLY TOPICALLY TO AFFECTED AREA(S) EVERY DAY . MIX IN WITH CERAVE CREAM AS DIRECTED   omeprazole 20 MG capsule Commonly known as: PRILOSEC   oxybutynin 5 MG tablet Commonly known as: DITROPAN Take 1 tablet (5 mg total) by mouth 3 (three) times daily.   traZODone 50 MG tablet Commonly known as: DESYREL   trimethoprim 100 MG tablet Commonly  known as: TRIMPEX Take 1 tablet (100 mg total) by mouth daily.        Allergies:  Allergies  Allergen Reactions   No Known Allergies     Family History: No family history on file.  Social History:  reports that he has quit smoking. He has never used smokeless tobacco. He reports current alcohol use. He reports that he does not use drugs.  ROS:                                        Physical Exam: BP (!) 129/56   Pulse 76   Ht 6' (1.829 m)   Wt 96.2 kg   BMI 28.75 kg/m   Constitutional:  Alert and oriented, No acute distress.  Laboratory Data: No results found for: "WBC", "HGB", "HCT", "MCV", "PLT"  No results found for: "CREATININE"  No results found for: "PSA"  No results found for: "TESTOSTERONE"  No results found for: "HGBA1C"  Urinalysis    Component Value Date/Time   APPEARANCEUR Cloudy (A) 05/04/2021 1031   GLUCOSEU Negative 05/04/2021 1031   BILIRUBINUR Negative 05/04/2021 1031   PROTEINUR Negative 05/04/2021 1031   NITRITE Negative 05/04/2021 1031   LEUKOCYTESUR 1+ (A) 05/04/2021 1031    Pertinent Imaging:   Assessment & Plan:  All prescriptions renewed 3 months x 3 refills.  Continue catheterizing 6 times a day.  Reassess in 1 year.  I will get an ultrasound next year has been stable  1. Neurogenic bladder  - Urinalysis, Complete   No follow-ups on file.  Martina Sinner, MD  [facility] [address] [place], [place] [address] [phone]

## 2023-06-06 ENCOUNTER — Ambulatory Visit (INDEPENDENT_AMBULATORY_CARE_PROVIDER_SITE_OTHER): Payer: Medicare HMO | Admitting: Dermatology

## 2023-06-06 ENCOUNTER — Encounter: Payer: Self-pay | Admitting: Dermatology

## 2023-06-06 VITALS — BP 163/83 | HR 69

## 2023-06-06 DIAGNOSIS — L570 Actinic keratosis: Secondary | ICD-10-CM | POA: Diagnosis not present

## 2023-06-06 DIAGNOSIS — W908XXA Exposure to other nonionizing radiation, initial encounter: Secondary | ICD-10-CM

## 2023-06-06 DIAGNOSIS — D692 Other nonthrombocytopenic purpura: Secondary | ICD-10-CM

## 2023-06-06 DIAGNOSIS — D229 Melanocytic nevi, unspecified: Secondary | ICD-10-CM

## 2023-06-06 DIAGNOSIS — Z1283 Encounter for screening for malignant neoplasm of skin: Secondary | ICD-10-CM

## 2023-06-06 DIAGNOSIS — L820 Inflamed seborrheic keratosis: Secondary | ICD-10-CM | POA: Diagnosis not present

## 2023-06-06 DIAGNOSIS — L578 Other skin changes due to chronic exposure to nonionizing radiation: Secondary | ICD-10-CM | POA: Diagnosis not present

## 2023-06-06 DIAGNOSIS — Z7189 Other specified counseling: Secondary | ICD-10-CM

## 2023-06-06 DIAGNOSIS — L814 Other melanin hyperpigmentation: Secondary | ICD-10-CM

## 2023-06-06 DIAGNOSIS — L821 Other seborrheic keratosis: Secondary | ICD-10-CM

## 2023-06-06 DIAGNOSIS — D1801 Hemangioma of skin and subcutaneous tissue: Secondary | ICD-10-CM

## 2023-06-06 DIAGNOSIS — Z872 Personal history of diseases of the skin and subcutaneous tissue: Secondary | ICD-10-CM

## 2023-06-06 NOTE — Progress Notes (Signed)
Follow-Up Visit   Subjective  Hector Simmons is a 82 y.o. male who presents for the following: Skin Cancer Screening and Upper Body Skin Exam. C/O concerning area at mid back between shoulder blades. Hx of Aks. No personal Hx of skin cancer or dysplastic nevi. Hx of PDT treatments to scalp 11/2020 and 12/2020.  C/O bruising on arms and legs.   The patient presents for Upper Body Skin Exam (UBSE) for skin cancer screening and mole check. The patient has spots, moles and lesions to be evaluated, some may be new or changing and the patient may have concern these could be cancer.    The following portions of the chart were reviewed this encounter and updated as appropriate: medications, allergies, medical history  Review of Systems:  No other skin or systemic complaints except as noted in HPI or Assessment and Plan.  Objective  Well appearing patient in no apparent distress; mood and affect are within normal limits.  All skin waist up examined, B/L legs examined. Relevant physical exam findings are noted in the Assessment and Plan.  right spinal upper back x1, right upper arm x1 (2) Erythematous keratotic or waxy stuck-on papule or plaque.  Right mid dorsal Forearm x1, scalp x6 (7) Erythematous thin papules/macules with gritty scale at scalp. Pink scaly macule at right arm.    Assessment & Plan   Inflamed seborrheic keratosis (2) right spinal upper back x1, right upper arm x1  Symptomatic, irritating, patient would like treated.   May require addition LN2 treatment at right back.  Destruction of lesion - right spinal upper back x1, right upper arm x1 (2)  Destruction method: cryotherapy   Informed consent: discussed and consent obtained   Lesion destroyed using liquid nitrogen: Yes   Region frozen until ice ball extended beyond lesion: Yes   Outcome: patient tolerated procedure well with no complications   Post-procedure details: wound care instructions given    Additional details:  Prior to procedure, discussed risks of blister formation, small wound, skin dyspigmentation, or rare scar following cryotherapy. Recommend Vaseline ointment to treated areas while healing.   AK (actinic keratosis) (7) Right mid dorsal Forearm x1, scalp x6  Actinic keratoses are precancerous spots that appear secondary to cumulative UV radiation exposure/sun exposure over time. They are chronic with expected duration over 1 year. A portion of actinic keratoses will progress to squamous cell carcinoma of the skin. It is not possible to reliably predict which spots will progress to skin cancer and so treatment is recommended to prevent development of skin cancer.  Recommend daily broad spectrum sunscreen SPF 30+ to sun-exposed areas, reapply every 2 hours as needed.  Recommend staying in the shade or wearing long sleeves, sun glasses (UVA+UVB protection) and wide brim hats (4-inch brim around the entire circumference of the hat). Call for new or changing lesions.  Destruction of lesion - Right mid dorsal Forearm x1, scalp x6 (7)  Destruction method: cryotherapy   Informed consent: discussed and consent obtained   Lesion destroyed using liquid nitrogen: Yes   Region frozen until ice ball extended beyond lesion: Yes   Outcome: patient tolerated procedure well with no complications   Post-procedure details: wound care instructions given   Additional details:  Prior to procedure, discussed risks of blister formation, small wound, skin dyspigmentation, or rare scar following cryotherapy. Recommend Vaseline ointment to treated areas while healing.    Skin cancer screening performed today.  ACTINIC DAMAGE WITH PRECANCEROUS ACTINIC KERATOSES Counseling for Topical  Chemotherapy Management: Patient exhibits: - Severe, confluent actinic changes with pre-cancerous actinic keratoses that is secondary to cumulative UV radiation exposure over time - Condition that is severe; chronic,  not at goal. - diffuse scaly erythematous macules and papules with underlying dyspigmentation - Discussed Prescription "Field Treatment" topical Chemotherapy for Severe, Chronic Confluent Actinic Changes with Pre-Cancerous Actinic Keratoses Field treatment involves treatment of an entire area of skin that has confluent Actinic Changes (Sun/ Ultraviolet light damage) and PreCancerous Actinic Keratoses by method of PhotoDynamic Therapy (PDT) and/or prescription Topical Chemotherapy agents such as 5-fluorouracil, 5-fluorouracil/calcipotriene, and/or imiquimod.  The purpose is to decrease the number of clinically evident and subclinical PreCancerous lesions to prevent progression to development of skin cancer by chemically destroying early precancer changes that may or may not be visible.  It has been shown to reduce the risk of developing skin cancer in the treated area. As a result of treatment, redness, scaling, crusting, and open sores may occur during treatment course. One or more than one of these methods may be used and may have to be used several times to control, suppress and eliminate the PreCancerous changes. Discussed treatment course, expected reaction, and possible side effects. - Recommend daily broad spectrum sunscreen SPF 30+ to sun-exposed areas, reapply every 2 hours as needed.  - Staying in the shade or wearing long sleeves, sun glasses (UVA+UVB protection) and wide brim hats (4-inch brim around the entire circumference of the hat) are also recommended. - Call for new or changing lesions.  - Recommend red light PDT with debridement to scalp, schedule this fall/winter. Plan 1 treatment. Also discussed 5FU/Calcipotriene treatment for scalp if prefers topical treatment at home.  Patient will call back for treatment he prefers.    Lentigines, Seborrheic Keratoses, Hemangiomas - Benign normal skin lesions - Benign-appearing - Call for any changes  Melanocytic Nevi - Tan-brown and/or  pink-flesh-colored symmetric macules and papules - Benign appearing on exam today - Observation - Call clinic for new or changing moles - Recommend daily use of broad spectrum spf 30+ sunscreen to sun-exposed areas.   Purpura - Chronic; persistent and recurrent.  Treatable, but not curable. - Violaceous macules and patches at arms and legs - Benign - Related to trauma, age, sun damage and/or use of blood thinners, chronic use of topical and/or oral steroids - Observe - Can use OTC arnica containing moisturizer such as Dermend Bruise Formula if desired - Call for worsening or other concerns   Return in about 1 year (around 06/05/2024) for UBSE. HxAK, PDT/Red light with debridement to scalp Next Available.  I, Lawson Radar, CMA, am acting as scribe for Willeen Niece, MD.   Documentation: I have reviewed the above documentation for accuracy and completeness, and I agree with the above.  Willeen Niece, MD

## 2023-06-06 NOTE — Patient Instructions (Addendum)
Cryotherapy Aftercare  Wash gently with soap and water everyday.   Apply Vaseline Jelly daily until healed.    Recommend daily broad spectrum sunscreen SPF 30+ to sun-exposed areas, reapply every 2 hours as needed. Call for new or changing lesions.  Staying in the shade or wearing long sleeves, sun glasses (UVA+UVB protection) and wide brim hats (4-inch brim around the entire circumference of the hat) are also recommended for sun protection.   For bruising on arms: Can use OTC arnica containing moisturizer such as Dermend Bruise Formula if desired   Photodynamic Therapy/Blue/Red Light Therapy  Actinic keratoses are the dry, red scaly spots on the skin caused by sun damage. A portion of these spots can turn into skin cancer with time, and treating them can help prevent development of skin cancer.   Treatment of these spots requires removal of the defective skin cells. There are various ways to remove actinic keratoses, including freezing with liquid nitrogen, treatment with creams, or treatment with a blue light procedure in the office.   Photodynamic Therapy (PDT), also known as "red light therapy" is an in office procedure used to treat actinic keratoses. It works by targeting precancerous cells. After treatment, these cells peel off and are replaced by healthy ones.   For your phototherapy appointment, you will have two appointments on the day of your treatment. The first appointment will be to apply a cream to the treatment area. You will leave this cream on for 1-2 hours depending on the area being treated. The second appointment will be to shine a blue light on the area for 16 minutes to kill off the precancer cells. It is common to experience a burning sensation during the treatment.  After your treatment, it will be important to keep the treated areas of skin out of the sun completely for 48-72 hours (2-3 days) to prevent having a reaction.   Common side effects include: - Burning or  stinging, which may be severe and can last up to 24-72 hours after your treatment - Scaling and crusting which may last up to 2 weeks - Redness, swelling and/or peeling which can last up to 4 weeks  To Care for Your Skin After PDT/Red Light Therapy: - Wash with soap, water and shampoo as normal. - If needed, you can use cold compresses (e.g. ice packs) for comfort - If okay with your primary care doctor, you may use analgesics such as acetaminophen (tylenol) every 4-6 hours, not to exceed recommended dose - You may apply Cerave Healing Ointment, Vaseline or Aquaphor as needed - If you have a lot of swelling you may take a Benadryl to help with this (this may cause drowsiness), not to exceed recommended dose. This may increase the risk of falls in people over 65 and may slow reaction time while driving, so it is not recommended to take before driving or operating machinery. - Sun Precautions - Wear a wide brim hat for the next week if outside  - Wear a sunblock with zinc or titanium dioxide at least SPF 50 daily  If you have any questions or concerns, please call the office and ask to speak with a nurse.      Melanoma ABCDEs  Melanoma is the most dangerous type of skin cancer, and is the leading cause of death from skin disease.  You are more likely to develop melanoma if you: Have light-colored skin, light-colored eyes, or red or blond hair Spend a lot of time in the sun  Tan regularly, either outdoors or in a tanning bed Have had blistering sunburns, especially during childhood Have a close family member who has had a melanoma Have atypical moles or large birthmarks  Early detection of melanoma is key since treatment is typically straightforward and cure rates are extremely high if we catch it early.   The first sign of melanoma is often a change in a mole or a new dark spot.  The ABCDE system is a way of remembering the signs of melanoma.  A for asymmetry:  The two halves do not  match. B for border:  The edges of the growth are irregular. C for color:  A mixture of colors are present instead of an even brown color. D for diameter:  Melanomas are usually (but not always) greater than 6mm - the size of a pencil eraser. E for evolution:  The spot keeps changing in size, shape, and color.  Please check your skin once per month between visits. You can use a small mirror in front and a large mirror behind you to keep an eye on the back side or your body.   If you see any new or changing lesions before your next follow-up, please call to schedule a visit.  Please continue daily skin protection including broad spectrum sunscreen SPF 30+ to sun-exposed areas, reapplying every 2 hours as needed when you're outdoors.   Staying in the shade or wearing long sleeves, sun glasses (UVA+UVB protection) and wide brim hats (4-inch brim around the entire circumference of the hat) are also recommended for sun protection.    Due to recent changes in healthcare laws, you may see results of your pathology and/or laboratory studies on MyChart before the doctors have had a chance to review them. We understand that in some cases there may be results that are confusing or concerning to you. Please understand that not all results are received at the same time and often the doctors may need to interpret multiple results in order to provide you with the best plan of care or course of treatment. Therefore, we ask that you please give [place] 2 business days to thoroughly review all your results before contacting the office for clarification. Should we see a critical lab result, you will be contacted sooner.   If You Need Anything After Your Visit  If you have any questions or concerns for your doctor, please call our main line at [phone] and press option 4 to reach your doctor's medical assistant. If no one answers, please leave a voicemail as directed and we will return your call as soon as possible.  Messages left after 4 pm will be answered the following business day.   You may also send [place] a message via MyChart. We typically respond to MyChart messages within 1-2 business days.  For prescription refills, please ask your pharmacy to contact our office. Our fax number is [phone].  If you have an urgent issue when the clinic is closed that cannot wait until the next business day, you can page your doctor at the number below.    Please note that while we do our best to be available for urgent issues outside of office hours, we are not available 24/7.   If you have an urgent issue and are unable to reach [place], you may choose to seek medical care at your doctor's office, retail clinic, urgent care center, or emergency room.  If you have a medical emergency, please immediately call 911 or  go to the emergency department.  Pager Numbers  - Dr. Gwen Pounds: [phone]  - Dr. Roseanne Reno: [phone]  - Dr. Katrinka Blazing: [phone]   In the event of inclement weather, please call our main line at [phone] for an update on the status of any delays or closures.  Dermatology Medication Tips: Please keep the boxes that topical medications come in in order to help keep track of the instructions about where and how to use these. Pharmacies typically print the medication instructions only on the boxes and not directly on the medication tubes.   If your medication is too expensive, please contact our office at [phone] option 4 or send [place] a message through MyChart.   We are unable to tell what your co-pay for medications will be in advance as this is different depending on your insurance coverage. However, we may be able to find a substitute medication at lower cost or fill out paperwork to get insurance to cover a needed medication.   If a prior authorization is required to get your medication covered by your insurance company, please allow [place] 1-2 business days to complete this process.  Drug  prices often vary depending on where the prescription is filled and some pharmacies may offer cheaper prices.  The website www.goodrx.com contains coupons for medications through different pharmacies. The prices here do not account for what the cost may be with help from insurance (it may be cheaper with your insurance), but the website can give you the price if you did not use any insurance.  - You can print the associated coupon and take it with your prescription to the pharmacy.  - You may also stop by our office during regular business hours and pick up a GoodRx coupon card.  - If you need your prescription sent electronically to a different pharmacy, notify our office through [facility] or by phone at [phone] option 4.     Si Usted Necesita Algo Despus de Su Visita  Tambin puede enviarnos un mensaje a travs de Clinical cytogeneticist. Por lo general respondemos a los mensajes de MyChart en el transcurso de 1 a 2 das hbiles.  Para renovar recetas, por favor pida a su farmacia que se ponga en contacto con nuestra oficina. Annie Sable de fax es [place] [phone].  Si tiene un asunto urgente cuando la clnica est cerrada y que no puede esperar hasta el siguiente da hbil, puede llamar/localizar a su doctor(a) al nmero que aparece a continuacin.   Por favor, tenga en cuenta que aunque hacemos todo lo posible para estar disponibles para asuntos urgentes fuera del horario de [place], no estamos disponibles las 24 horas del da, los 7 [address]  [address] de la [place].   Si tiene un problema urgente y no puede comunicarse con nosotros, puede optar por buscar atencin mdica  en el consultorio de su doctor(a), en una clnica privada, en un centro de atencin urgente o en una sala de emergencias.  Si tiene Engineer, drilling, por favor llame inmediatamente al 911 o vaya a la sala de emergencias.  Nmeros de bper  - Dr. Gwen Pounds: [phone]  - Dra. Roseanne Reno: [phone]  - Dr. Katrinka Blazing:  [phone]   En caso de inclemencias del tiempo, por favor llame a Lacy Duverney principal al [phone] para una actualizacin sobre el [place] de cualquier retraso o cierre.  Consejos para la medicacin en dermatologa: Por favor, guarde las cajas en las que vienen los medicamentos de uso tpico para ayudarle a seguir las instrucciones sobre  dnde y cmo usarlos. Las farmacias generalmente imprimen las instrucciones del medicamento slo en las cajas y no directamente en los tubos del [place].   Si su medicamento es muy caro, por favor, pngase en contacto con Rolm Gala llamando al [phone] y presione la opcin 4 o envenos un mensaje a travs de Clinical cytogeneticist.   No podemos decirle cul ser su copago por los medicamentos por adelantado ya que esto es diferente dependiendo de la cobertura de su seguro. Sin embargo, es posible que podamos encontrar un medicamento sustituto a Audiological scientist un formulario para que el seguro cubra el medicamento que se considera necesario.   Si se requiere una autorizacin previa para que su compaa de seguros [place] su medicamento, por favor permtanos de 1 a 2 das hbiles para completar [address]  Los precios de los medicamentos varan con frecuencia dependiendo del Environmental consultant de dnde se surte la receta y alguna farmacias pueden ofrecer precios ms baratos.  El sitio web www.goodrx.com tiene cupones para medicamentos de Health and safety inspector. Los precios aqu no tienen en cuenta lo que podra costar con la ayuda del seguro (puede ser ms barato con su seguro), pero el sitio web puede darle el precio si no utiliz Tourist information centre manager.  - Puede imprimir el cupn correspondiente y llevarlo con su receta a la farmacia.  - Tambin puede pasar por nuestra oficina durante el horario de atencin regular y Education officer, museum una tarjeta de cupones de GoodRx.  - Si necesita que su receta se enve electrnicamente a una farmacia diferente, informe a nuestra oficina a travs de  MyChart de [place] o por telfono llamando al [phone] y presione la opcin 4.

## 2023-06-22 ENCOUNTER — Encounter: Payer: Self-pay | Admitting: Internal Medicine

## 2023-06-22 ENCOUNTER — Ambulatory Visit: Payer: Medicare HMO | Admitting: Internal Medicine

## 2023-06-22 VITALS — BP 128/62 | HR 77 | Temp 96.0°F | Ht 73.0 in | Wt 211.0 lb

## 2023-06-22 DIAGNOSIS — N189 Chronic kidney disease, unspecified: Secondary | ICD-10-CM

## 2023-06-22 DIAGNOSIS — K592 Neurogenic bowel, not elsewhere classified: Secondary | ICD-10-CM

## 2023-06-22 DIAGNOSIS — G629 Polyneuropathy, unspecified: Secondary | ICD-10-CM

## 2023-06-22 DIAGNOSIS — D631 Anemia in chronic kidney disease: Secondary | ICD-10-CM

## 2023-06-22 DIAGNOSIS — I10 Essential (primary) hypertension: Secondary | ICD-10-CM

## 2023-06-22 DIAGNOSIS — N319 Neuromuscular dysfunction of bladder, unspecified: Secondary | ICD-10-CM

## 2023-06-22 DIAGNOSIS — N1832 Chronic kidney disease, stage 3b: Secondary | ICD-10-CM

## 2023-06-22 DIAGNOSIS — E782 Mixed hyperlipidemia: Secondary | ICD-10-CM

## 2023-06-22 DIAGNOSIS — G8220 Paraplegia, unspecified: Secondary | ICD-10-CM

## 2023-06-22 DIAGNOSIS — K219 Gastro-esophageal reflux disease without esophagitis: Secondary | ICD-10-CM

## 2023-06-22 DIAGNOSIS — Z23 Encounter for immunization: Secondary | ICD-10-CM | POA: Diagnosis not present

## 2023-06-22 DIAGNOSIS — R7303 Prediabetes: Secondary | ICD-10-CM

## 2023-06-22 DIAGNOSIS — N390 Urinary tract infection, site not specified: Secondary | ICD-10-CM

## 2023-06-22 DIAGNOSIS — G4700 Insomnia, unspecified: Secondary | ICD-10-CM | POA: Insufficient documentation

## 2023-06-22 DIAGNOSIS — F5101 Primary insomnia: Secondary | ICD-10-CM

## 2023-06-22 MED ORDER — BELSOMRA 10 MG PO TABS: 10.0000 mg | ORAL_TABLET | Freq: Every evening | ORAL | 0 refills | Status: DC | PRN

## 2023-06-22 NOTE — Progress Notes (Signed)
HPI  Patient presents to clinic today to establish care and for management of the conditions listed below.  GERD: Triggered by tomato based sauces with pepperoni.  He denies breakthrough on omeprazole.  There is no upper GI on file.  HTN: His BP today is 128/62.  He is taking amlodipine and lisinopril as prescribed.  There is no ECG on file.  CKD: His last creatinine was 1.83, GFR 36, 05/2023.  He is on lisinopril for renal protection.  He follows with nephrology.  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  He is not taking any oral iron at this time due to constipation.  He does not follow with hematology.  Prediabetes: His last A1c was 5.4 per his report.  He is not taking any oral diabetic medication at this time.  He does not check his sugars.   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 He has to self cath. He is taking oxybutynin and lactulose as prescribed.  He follows with urology.  HLD: There is no lipid profile on file.  He denies myalgias on lovastatin.  He tries to consume a low-fat diet.  Insomnia: He has difficulty staying asleep.  He is taking trazodone as prescribed but he does not feel like it is effective.  There is no sleep study on file.  Recurrent UTI: Managed with trimethoprim.  He follows with urology.  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     BP (high blood pressure) 09/17/2013   Last Assessment & Plan:  Hypertension is stable. Continue current treatment regimen. Dietary sodium restriction. Regular aerobic exercise. Continue current medications.    Chronic kidney disease (CKD), stage III (moderate) (HCC) 10/23/2013   Last Assessment & Plan:   Renal condition is stable. Continue current treatment regimen.    CN (constipation) 01/22/2014   Last Assessment & Plan:  Start PEG powder every day.  incr water (hard w his situation fo self cathing).     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  Last Assessment & Plan:  Continue current exercise regimen to maximize lower extremity strength.  Discussed PT with patient, but patient reports being able to do exercises on his own as taught.      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  Last Assessment & Plan:  Refer to podiatry one time per year.     Type 2 diabetes mellitus (HCC) 09/28/2015   Overview:  cataract Last Assessment & Plan:  Renal condition is stable. Continue current treatment regimen. Regular aerobic exercise. Continue current medications.     Current Outpatient Medications  Medication Sig Dispense Refill   amLODipine (NORVASC) 5 MG tablet      lactulose (CHRONULAC) 10 GM/15ML solution SMARTSIG:Milliliter(s) By Mouth     lisinopril (ZESTRIL) 40 MG tablet Take 1 tablet by mouth daily.     lovastatin (MEVACOR) 40 MG tablet      omeprazole (PRILOSEC) 20 MG capsule      oxybutynin (DITROPAN) 5 MG tablet Take 1  tablet (5 mg total) by mouth 3 (three) times daily. 270 tablet 3   traZODone (DESYREL) 50 MG tablet      trimethoprim (TRIMPEX) 100 MG tablet Take 1 tablet (100 mg total) by mouth daily. 90 tablet 3   No current facility-administered medications for this visit.    Allergies  Allergen Reactions   No Known Allergies     No family history on file.  Social History   Socioeconomic History   Marital status: Single    Spouse name: Not on file   Number of children: Not on file   Years of education: Not on file   Highest education level: Not on file   Occupational History   Not on file  Tobacco Use   Smoking status: Former   Smokeless tobacco: Never  Substance and Sexual Activity   Alcohol use: Yes    Alcohol/week: 0.0 standard drinks of alcohol   Drug use: No   Sexual activity: Not on file  Other Topics Concern   Not on file  Social History Narrative   Not on file   Social Determinants of Health   Financial Resource Strain: Not on file  Food Insecurity: Not on file  Transportation Needs: Not on file  Physical Activity: Not on file  Stress: Not on file  Social Connections: Not on file  Intimate Partner Violence: Not on file    ROS:  Constitutional: Denies fever, malaise, fatigue, headache or abrupt weight changes.  HEENT: Denies eye pain, eye redness, ear pain, ringing in the ears, wax buildup, runny nose, nasal congestion, bloody nose, or sore throat. Respiratory: Denies difficulty breathing, shortness of breath, cough or sputum production.   Cardiovascular: Denies chest pain, chest tightness, palpitations or swelling in the hands or feet.  Gastrointestinal: Patient reports chronic constipation.  Denies abdominal pain, bloating, diarrhea or blood in the stool.  GU: Pt reports urinary retention. Denies frequency, urgency, pain with urination, blood in urine, odor or discharge. Musculoskeletal: Patient reports difficulty with gait.  Denies decrease in range of motion, muscle pain or joint pain and swelling.  Skin: Denies redness, rashes, lesions or ulcercations.  Neurological: Patient reports insomnia, neuropathic pain.  Denies dizziness, difficulty with memory, difficulty with speech or problems with coordination.  Psych: Denies anxiety, depression, SI/HI.  No other specific complaints in a complete review of systems (except as listed in HPI above).  PE:  BP 128/62 (BP Location: Left Arm, Patient Position: Sitting, Cuff Size: Normal)   Pulse 77   Temp (!) 96 F (35.6 C) (Temporal)   Ht 6\' 1"  (1.854 m)   Wt 211 lb  (95.7 kg)   SpO2 97%   BMI 27.84 kg/m   Wt Readings from Last 3 Encounters:  02/27/23 212 lb (96.2 kg)  02/28/22 215 lb (97.5 kg)  05/04/21 209 lb (94.8 kg)    General: Appears his stated age, overweight, in NAD. Skin: Senile purpura noted.  No ulcerations. HEENT: Head: normal shape and size; Eyes: sclera white, no icterus, conjunctiva pink, PERRLA and EOMs intact;  Cardiovascular: Normal rate and rhythm. S1,S2 noted.  No murmur, rubs or gallops noted. No JVD or BLE edema. No carotid bruits noted. Pulmonary/Chest: Normal effort and positive vesicular breath sounds. No respiratory distress. No wheezes, rales or ronchi noted.  Abdomen: Soft and nontender. Normal bowel sounds. Musculoskeletal: Ataxic gait with use of cane. Neurological: Alert and oriented.  Psychiatric: Mood and affect normal. Behavior is normal. Judgment and thought content normal.  Assessment and Plan:    RTC in 6 months for your annual exam Nicki Reaper, NP

## 2023-06-22 NOTE — Assessment & Plan Note (Signed)
Continue oxybutynin and self cath He will continue to follow with urology

## 2023-06-22 NOTE — Assessment & Plan Note (Signed)
Controlled on amlodipine and lisinopril Reinforced DASH diet Kidney function reviewed

## 2023-06-22 NOTE — Assessment & Plan Note (Signed)
Lipid profile today Encouraged to consume a low-fat diet Continue lovastatin

## 2023-06-22 NOTE — Assessment & Plan Note (Signed)
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

## 2023-06-22 NOTE — Patient Instructions (Signed)
Insomnia Insomnia is a sleep disorder that makes it difficult to fall asleep or stay asleep. Insomnia can cause fatigue, low energy, difficulty concentrating, mood swings, and poor performance at work or school. There are three different ways to classify insomnia: Difficulty falling asleep. Difficulty staying asleep. Waking up too early in the morning. Any type of insomnia can be long-term (chronic) or short-term (acute). Both are common. Short-term insomnia usually lasts for 3 months or less. Chronic insomnia occurs at least three times a week for longer than 3 months. What are the causes? Insomnia may be caused by another condition, situation, or substance, such as: Having certain mental health conditions, such as anxiety and depression. Using caffeine, alcohol, tobacco, or drugs. Having gastrointestinal conditions, such as gastroesophageal reflux disease (GERD). Having certain medical conditions. These include: Asthma. Alzheimer's disease. Stroke. Chronic pain. An overactive thyroid gland (hyperthyroidism). Other sleep disorders, such as restless legs syndrome and sleep apnea. Menopause. Sometimes, the cause of insomnia may not be known. What increases the risk? Risk factors for insomnia include: Gender. Females are affected more often than males. Age. Insomnia is more common as people get older. Stress and certain medical and mental health conditions. Lack of exercise. Having an irregular work schedule. This may include working night shifts and traveling between different time zones. What are the signs or symptoms? If you have insomnia, the main symptom is having trouble falling asleep or having trouble staying asleep. This may lead to other symptoms, such as: Feeling tired or having low energy. Feeling nervous about going to sleep. Not feeling rested in the morning. Having trouble concentrating. Feeling irritable, anxious, or depressed. How is this diagnosed? This condition  may be diagnosed based on: Your symptoms and medical history. Your health care provider may ask about: Your sleep habits. Any medical conditions you have. Your mental health. A physical exam. How is this treated? Treatment for insomnia depends on the cause. Treatment may focus on treating an underlying condition that is causing the insomnia. Treatment may also include: Medicines to help you sleep. Counseling or therapy. Lifestyle adjustments to help you sleep better. Follow these instructions at home: Eating and drinking  Limit or avoid alcohol, caffeinated beverages, and products that contain nicotine and tobacco, especially close to bedtime. These can disrupt your sleep. Do not eat a large meal or eat spicy foods right before bedtime. This can lead to digestive discomfort that can make it hard for you to sleep. Sleep habits  Keep a sleep diary to help you and your health care provider figure out what could be causing your insomnia. Write down: When you sleep. When you wake up during the night. How well you sleep and how rested you feel the next day. Any side effects of medicines you are taking. What you eat and drink. Make your bedroom a dark, comfortable place where it is easy to fall asleep. Put up shades or blackout curtains to block light from outside. Use a white noise machine to block noise. Keep the temperature cool. Limit screen use before bedtime. This includes: Not watching TV. Not using your smartphone, tablet, or computer. Stick to a routine that includes going to bed and waking up at the same times every day and night. This can help you fall asleep faster. Consider making a quiet activity, such as reading, part of your nighttime routine. Try to avoid taking naps during the day so that you sleep better at night. Get out of bed if you are still awake after   15 minutes of trying to sleep. Keep the lights down, but try reading or doing a quiet activity. When you feel  sleepy, go back to bed. General instructions Take over-the-counter and prescription medicines only as told by your health care provider. Exercise regularly as told by your health care provider. However, avoid exercising in the hours right before bedtime. Use relaxation techniques to manage stress. Ask your health care provider to suggest some techniques that may work well for you. These may include: Breathing exercises. Routines to release muscle tension. Visualizing peaceful scenes. Make sure that you drive carefully. Do not drive if you feel very sleepy. Keep all follow-up visits. This is important. Contact a health care provider if: You are tired throughout the day. You have trouble in your daily routine due to sleepiness. You continue to have sleep problems, or your sleep problems get worse. Get help right away if: You have thoughts about hurting yourself or someone else. Get help right away if you feel like you may hurt yourself or others, or have thoughts about taking your own life. Go to your nearest emergency room or: Call 911. Call the National Suicide Prevention Lifeline at 1-800-273-8255 or 988. This is open 24 hours a day. Text the Crisis Text Line at 741741. Summary Insomnia is a sleep disorder that makes it difficult to fall asleep or stay asleep. Insomnia can be long-term (chronic) or short-term (acute). Treatment for insomnia depends on the cause. Treatment may focus on treating an underlying condition that is causing the insomnia. Keep a sleep diary to help you and your health care provider figure out what could be causing your insomnia. This information is not intended to replace advice given to you by your health care provider. Make sure you discuss any questions you have with your health care provider. Document Revised: 08/09/2021 Document Reviewed: 08/09/2021 Elsevier Patient Education  2024 [employer].  

## 2023-06-22 NOTE — Assessment & Plan Note (Signed)
Still having gait abnormalities Monitor

## 2023-06-22 NOTE — Assessment & Plan Note (Signed)
Try to avoid foods that trigger reflux Encourage weight loss as this can help reduce reflux symptoms Continue omeprazole

## 2023-06-22 NOTE — Assessment & Plan Note (Signed)
Continue lactulose Encouraged high-fiber diet and adequate water intake

## 2023-06-22 NOTE — Assessment & Plan Note (Signed)
Continue trimethoprim per urology

## 2023-06-22 NOTE — Assessment & Plan Note (Signed)
A1c today Encourage low-carb diet 

## 2023-06-22 NOTE — Assessment & Plan Note (Signed)
Will discontinue trazodone and due to ineffectiveness We will trial Belsomra 10 mg daily

## 2023-06-22 NOTE — Assessment & Plan Note (Signed)
No longer on gabapentin We will monitor

## 2023-06-22 NOTE — Assessment & Plan Note (Signed)
Kidney function reviewed Continue lisinopril for renal protection He will continue to follow with nephrology

## 2023-06-23 LAB — LIPID PANEL
Cholesterol: 129 mg/dL (ref <200)
HDL: 56 mg/dL (ref >=40)
LDL Cholesterol (Calc): 57 mg/dL
Non-HDL Cholesterol (Calc): 73 mg/dL (ref <130)
Total CHOL/HDL Ratio: 2.3 (calc) (ref <5.0)
Triglycerides: 79 mg/dL (ref <150)

## 2023-06-23 LAB — CBC
HCT: 31 % — ABNORMAL LOW (ref 38.5–50.0)
Hemoglobin: 10.1 g/dL — ABNORMAL LOW (ref 13.2–17.1)
MCH: 32.4 pg (ref 27.0–33.0)
MCHC: 32.6 g/dL (ref 32.0–36.0)
MCV: 99.4 fL (ref 80.0–100.0)
MPV: 9.4 fL (ref 7.5–12.5)
Platelets: 200 10*3/uL (ref 140–400)
RBC: 3.12 10*6/uL — ABNORMAL LOW (ref 4.20–5.80)
RDW: 13.9 % (ref 11.0–15.0)
WBC: 7 10*3/uL (ref 3.8–10.8)

## 2023-06-23 LAB — HEMOGLOBIN A1C
Hgb A1c MFr Bld: 5.6 %{Hb} (ref <5.7)
Mean Plasma Glucose: 114 mg/dL
eAG (mmol/L): 6.3 mmol/L

## 2023-06-26 ENCOUNTER — Telehealth: Payer: Self-pay | Admitting: Internal Medicine

## 2023-06-26 NOTE — Telephone Encounter (Signed)
Called [employer] and spoke with [place], pharmacy tech. She states that the prescription for Suvorexant ( Belsomra) was received 06/22/23. Medication was canceled due to the co pay being 125 dollars and patient could not pay.  Called patient and spoke with him in regard to medication request. Patient states he wanted the Suvorexant canceled due to the price being too expensive. Patient states he spoke with pharmacist and stated that he should try an alternative medication like Zolpidem 5mg  or 10 mg a tier 2 medication. Patient states he would like to try this medication. Please advise patient.

## 2023-06-26 NOTE — Telephone Encounter (Signed)
Patient called sttd he has not recvd his medication Suvorexant (BELSOMRA) 10 MG TABS and when he spoke with pharmacy on Friday they still had not gotten it. Please ff/u for patient

## 2023-06-27 NOTE — Telephone Encounter (Signed)
I am not prescribing Ambien to an 82 year old patient.  At this point I would recommend he increase his trazodone to 100 mg.  If that is not effective, then he should let me know.

## 2023-06-27 NOTE — Telephone Encounter (Signed)
Informed patient of Reginas recommendation.

## 2023-08-03 ENCOUNTER — Telehealth: Payer: Self-pay

## 2023-08-03 ENCOUNTER — Other Ambulatory Visit: Payer: Self-pay

## 2023-08-03 MED ORDER — OMEPRAZOLE 20 MG PO CPDR: 20.0000 mg | DELAYED_RELEASE_CAPSULE | Freq: Every day | ORAL | 1 refills | Status: DC

## 2023-08-03 MED ORDER — AMLODIPINE BESYLATE 5 MG PO TABS: 5.0000 mg | ORAL_TABLET | Freq: Every day | ORAL | 1 refills | Status: DC

## 2023-08-03 MED ORDER — TRAZODONE HCL 50 MG PO TABS: 50.0000 mg | ORAL_TABLET | Freq: Every evening | ORAL | 1 refills | Status: DC | PRN

## 2023-08-03 MED ORDER — LOVASTATIN 40 MG PO TABS: 40.0000 mg | ORAL_TABLET | Freq: Every day | ORAL | 1 refills | Status: DC

## 2023-08-03 MED ORDER — LISINOPRIL 40 MG PO TABS: 40.0000 mg | ORAL_TABLET | Freq: Every day | ORAL | 1 refills | Status: DC

## 2023-08-03 NOTE — Telephone Encounter (Signed)
Received a fax for patient for refills on Omeprazole 20 mg caps and Trazodone 50 mg tablet. I went to refill and I see that the medications show historical provider. Okay for refills? [facility]. Thanks!

## 2023-08-03 NOTE — Addendum Note (Signed)
Addended by: Lorre Munroe on: 08/03/2023 09:14 AM   Modules accepted: Orders

## 2023-08-03 NOTE — Telephone Encounter (Signed)
Medications refilled to [facility] pharmacy per request

## 2023-08-07 ENCOUNTER — Other Ambulatory Visit: Payer: Self-pay

## 2023-08-07 MED ORDER — TRIMETHOPRIM 100 MG PO TABS: 100.0000 mg | ORAL_TABLET | Freq: Every day | ORAL | 3 refills | Status: DC

## 2023-08-07 NOTE — Telephone Encounter (Signed)
Pt requesting a refill of trimethoprim.   LV 6/24   Refill sent in to mail order pharmacy per pt request.

## 2023-08-30 ENCOUNTER — Telehealth: Payer: Self-pay | Admitting: Internal Medicine

## 2023-08-30 NOTE — Telephone Encounter (Signed)
Called LVM 08/30/2023 to schedule Annual Wellness Visit. Please schedule office or virtual visits.  Verlee Rossetti; Care Guide Ambulatory Clinical Support [place] [facility] Direct Dial: [phone]

## 2023-09-01 ENCOUNTER — Telehealth: Payer: Self-pay | Admitting: Urology

## 2023-09-01 NOTE — Telephone Encounter (Signed)
Patient lvm stating that [facility] told him to contact us regarding refills for his Trimethoprim and Oxybutynin. They need our office to contact them for rx refills.

## 2023-09-01 NOTE — Telephone Encounter (Signed)
Spoke with patient and advised that rx was already called I, he assumed it was our office that wasn't responding.

## 2023-10-09 ENCOUNTER — Ambulatory Visit: Payer: Medicare HMO | Admitting: Physician Assistant

## 2023-10-09 VITALS — BP 123/66 | HR 96 | Ht 73.0 in | Wt 203.0 lb

## 2023-10-09 DIAGNOSIS — R3129 Other microscopic hematuria: Secondary | ICD-10-CM | POA: Diagnosis not present

## 2023-10-09 DIAGNOSIS — R8281 Pyuria: Secondary | ICD-10-CM | POA: Diagnosis not present

## 2023-10-09 DIAGNOSIS — M5489 Other dorsalgia: Secondary | ICD-10-CM

## 2023-10-09 DIAGNOSIS — N3001 Acute cystitis with hematuria: Secondary | ICD-10-CM

## 2023-10-09 LAB — MICROSCOPIC EXAMINATION

## 2023-10-09 LAB — URINALYSIS, COMPLETE
Bilirubin, UA: NEGATIVE
Glucose, UA: NEGATIVE
Ketones, UA: NEGATIVE
Nitrite, UA: NEGATIVE
Protein,UA: NEGATIVE
Specific Gravity, UA: 1.015 (ref 1.005–1.030)
Urobilinogen, Ur: 0.2 mg/dL (ref 0.2–1.0)
pH, UA: 5.5 (ref 5.0–7.5)

## 2023-10-09 MED ORDER — CIPROFLOXACIN HCL 250 MG PO TABS: 250.0000 mg | ORAL_TABLET | Freq: Two times a day (BID) | ORAL | 0 refills | Status: AC

## 2023-10-09 NOTE — Progress Notes (Signed)
10/09/2023 2:15 PM   Hector Simmons 11/11/1940 [id number]  CC: Chief Complaint  Patient presents with   Groin Pain   HPI: Hector Simmons is a 83 y.o. 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   Today he reports 2 weeks of right low back pain consistent with prior UTIs. He denies a stone history, fever, chills, nausea, vomiting, or gross hematuria.  In-office UA today positive for trace intact blood and trace leukocytes; urine microscopy with 11-30 WBCs/HPF and 11-30 RBCs/HPF.  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 Continue current treatment regimen. Dietary sodium restriction. Regular aerobic exercise. Continue current medications.    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 Continue current treatment regimen.    CN (constipation) 01/22/2014   Last Assessment & Plan:  Start PEG powder every day.  incr water (hard w his situation fo self cathing).     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  Last Assessment & Plan:  Continue current exercise regimen to maximize lower extremity strength.  Discussed PT with patient, but patient reports being able to do exercises on his own as taught.      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  Last Assessment & Plan:  Refer to podiatry one time per year.       Surgical History: Past Surgical History:  Procedure Laterality Date   BACK SURGERY     REPLACEMENT TOTAL KNEE Bilateral     Home Medications:  Allergies as of 10/09/2023       Reactions   No Known Allergies         Medication List        Accurate as of October 09, 2023  2:15 PM. If you have any questions, ask your nurse or doctor.          STOP taking these medications    ASPIRIN 81 PO   Belsomra 10 MG Tabs Generic drug: Suvorexant   carvedilol 6.25 MG tablet Commonly known as: COREG       TAKE these medications    amLODipine 5 MG tablet Commonly known as: NORVASC Take 1 tablet (5 mg total) by mouth daily.   lactulose 10 GM/15ML solution Commonly known as: CHRONULAC As needed   lisinopril 40 MG tablet Commonly known as: ZESTRIL Take 1 tablet (40 mg total) by mouth daily.   lovastatin 40 MG tablet Commonly known as: MEVACOR Take 1 tablet (40 mg total) by mouth at bedtime.   omeprazole 20 MG capsule Commonly known as: PRILOSEC Take 1 capsule (20 mg total) by mouth daily.   oxybutynin 5 MG tablet Commonly known as: DITROPAN Take 1 tablet (5 mg total) by mouth 3 (three) times daily.   traZODone 50 MG tablet Commonly known as: DESYREL Take 1 tablet (50 mg total) by mouth at bedtime as needed for sleep.  trimethoprim 100 MG tablet Commonly known as: TRIMPEX Take 1 tablet (100 mg total) by mouth daily.        Allergies:  Allergies  Allergen Reactions   No Known Allergies     Family History: Family History  Problem Relation Age of Onset   Stroke Father    Heart disease Father     Social History:   reports that he has quit smoking. He has never used smokeless tobacco. He reports current alcohol use. He reports that he does not use drugs.  Physical Exam: BP 123/66   Pulse 96   Ht 6\' 1"  (1.854 m)   Wt 203 lb (92.1 kg)   BMI 26.78 kg/m   Constitutional:  Alert and oriented, no acute distress, nontoxic appearing HEENT: [place],  AT Cardiovascular: No clubbing, cyanosis, or edema Respiratory: Normal respiratory effort, no increased work of breathing GU: No CVA tenderness Skin: No rashes, bruises or suspicious lesions Neurologic: Grossly intact, no focal deficits, moving all 4 extremities Psychiatric: Normal mood and affect  Laboratory Data: Results for orders placed or performed in visit on 10/09/23  Microscopic Examination   Collection Time: 10/09/23  1:57 PM   Urine  Result Value Ref Range   WBC, UA 11-30 (A) 0 - 5 /hpf   RBC, Urine 11-30 (A) 0 - 2 /hpf   Epithelial Cells (non renal) 0-10 0 - 10 /hpf   Mucus, UA Present (A) Not Estab.   Bacteria, UA Few None seen/Few  Urinalysis, Complete   Collection Time: 10/09/23  1:57 PM  Result Value Ref Range   Specific Gravity, UA 1.015 1.005 - 1.030   pH, UA 5.5 5.0 - 7.5   Color, UA Yellow Yellow   Appearance Ur Hazy (A) Clear   Leukocytes,UA Trace (A) Negative   Protein,UA Negative Negative/Trace   Glucose, UA Negative Negative   Ketones, UA Negative Negative   RBC, UA Trace (A) Negative   Bilirubin, UA Negative Negative   Urobilinogen, Ur 0.2 0.2 - 1.0 mg/dL   Nitrite, UA Negative Negative   Microscopic Examination See below:    Assessment & Plan:   1. Acute cystitis with hematuria (Primary) Symptoms are rather atypical but consistent with past infections. UA with pyuria and microheme; we discussed this is rather nonspecific and may be due to CIC alone. Will start empiric Cipro and send for culture. In the absence of CVA tenderness, I do not think his pain is 2/2 hydronephrosis. - Urinalysis, Complete - CULTURE, URINE COMPREHENSIVE - ciprofloxacin (CIPRO) 250 MG tablet; Take 1 tablet (250 mg total) by mouth 2 (two) times daily for 7 days.  Dispense: 14 tablet; Refill: 0   Return if symptoms worsen or fail to improve.  Carman Ching, PA-C  [facility] [place] [address], [address] [place], [place] [address] [phone]

## 2023-10-12 LAB — CULTURE, URINE COMPREHENSIVE

## 2023-11-10 ENCOUNTER — Other Ambulatory Visit: Payer: Self-pay | Admitting: Physician Assistant

## 2023-11-10 DIAGNOSIS — N3001 Acute cystitis with hematuria: Secondary | ICD-10-CM

## 2023-11-17 ENCOUNTER — Telehealth: Payer: Self-pay

## 2023-11-17 NOTE — Telephone Encounter (Signed)
 Patient called around 1:10 pm today stating that he does CIC at home, about 30 minutes before the call he did his routine CIC but noticed a "glue" substance at the end of the catheter that he placed in and he noticed a harder time getting his catheter in all the way "around the corner." Once he passed the catheter in he noticed urine coming out had bright red blood in it and there was bright red blood on the catheter that he pulled out. He is not having pain or burning sensation, no blood clots seen. He kept dripping/leaking blood from his penis for less than 10 minutes and it seemed to have stopped by the time I called which was about 30 minutes after his call. He was no in distress but wanted to know if there was anything else he needs to do at this time.  I spoke with Sam who advised to let us know if he starts seen large blood clots or feeling like he is not emptying well or having hard time passing a catheter to drain his urine, otherwise as long as he is not bleeding continuously at this time ok to follow up as needed.

## 2023-11-23 ENCOUNTER — Ambulatory Visit: Payer: Self-pay | Admitting: Internal Medicine

## 2023-11-23 NOTE — Telephone Encounter (Signed)
 Chief Complaint: fell last Friday hit head, "worms" noted in stool Symptoms: fell last Friday , tripped and fell hitting head above glasses. Bruised right eye . Reports headaches comes and goes to back of head. Denies severe pain or headache now. No other sx reported from fall. "Worms" noted in stool specimen today . Wants to bring sample to office. Frequency: fall last week , Friday , stool sample today  Pertinent Negatives: Patient denies chest pain , no difficulty breathing no severe headache, no blurred vision. No dizziness no N/V. No balance issues. Disposition: [] ED /[] Urgent Care (no appt availability in office) / [x] Appointment(In office/virtual)/ []  [facility]/ [] Home Care/ [] Refused Recommended Disposition /[] [facility]/ []  Follow-up with PCP Additional Notes:   Scheduled appt for fall for tomorrow with PCP . Reinforced to patient is sx change or worsen go to ED or call 911. Recommended taking picture of stool sample with "worms" and to review with PCP to collect another specimen in container from office . Please advise.        Copied from CRM [id number]. Topic: Appointments - Appointment Scheduling >> Nov 23, 2023  1:44 PM Priscille Loveless wrote: Pt called and stated that he fell last week and may have a concussion and that he also thinks that he has worms. He has a sample. I called the office twice and no answer.  I am not sure what to do. Reason for Disposition  MILD weakness (i.e., does not interfere with ability to work, go to school, normal activities)  (Exception: Mild weakness is a chronic symptom.)    Interferes with ability turning in bed after laying down  Answer Assessment - Initial Assessment Questions 1. MECHANISM: "How did the fall happen?"     Tripped and fell last Friday at girlfriends  2. DOMESTIC VIOLENCE AND ELDER ABUSE SCREENING: "Did you fall because someone pushed you or tried to hurt you?" If Yes, ask: "Are you safe now?"     na 3. ONSET:  "When did the fall happen?" (e.g., minutes, hours, or days ago)     Last Friday  4. LOCATION: "What part of the body hit the ground?" (e.g., back, buttocks, head, hips, knees, hands, head, stomach)     Hit above glasses line  front of head eye bruised   5. INJURY: "Did you hurt (injure) yourself when you fell?" If Yes, ask: "What did you injure? Tell me more about this?" (e.g., body area; type of injury; pain severity)"     Yes hit front of head above glasses, now bruise right eye area 6. PAIN: "Is there any pain?" If Yes, ask: "How bad is the pain?" (e.g., Scale 1-10; or mild,  moderate, severe)   - NONE (0): No pain   - MILD (1-3): Doesn't interfere with normal activities    - MODERATE (4-7): Interferes with normal activities or awakens from sleep    - SEVERE (8-10): Excruciating pain, unable to do any normal activities      Headache comes and goes did not rate pain  7. SIZE: For cuts, bruises, or swelling, ask: "How large is it?" (e.g., inches or centimeters)      Bruise to front of head,  8. PREGNANCY: "Is there any chance you are pregnant?" "When was your last menstrual period?"     na 9. OTHER SYMPTOMS: "Do you have any other symptoms?" (e.g., dizziness, fever, weakness; new onset or worsening).      Pain to lay down and turn, stool  noted with worms  10. CAUSE: "What do you think caused the fall (or falling)?" (e.g., tripped, dizzy spell)       Tripped  Protocols used: Falls and [facility]

## 2023-11-23 NOTE — Telephone Encounter (Signed)
 Will discuss at upcoming appt.

## 2023-11-24 ENCOUNTER — Encounter: Payer: Self-pay | Admitting: Internal Medicine

## 2023-11-24 ENCOUNTER — Ambulatory Visit: Admitting: Internal Medicine

## 2023-11-24 VITALS — BP 120/62 | Ht 73.0 in | Wt 208.8 lb

## 2023-11-24 DIAGNOSIS — M542 Cervicalgia: Secondary | ICD-10-CM | POA: Diagnosis not present

## 2023-11-24 DIAGNOSIS — S0511XA Contusion of eyeball and orbital tissues, right eye, initial encounter: Secondary | ICD-10-CM

## 2023-11-24 DIAGNOSIS — B839 Helminthiasis, unspecified: Secondary | ICD-10-CM

## 2023-11-24 DIAGNOSIS — N1832 Chronic kidney disease, stage 3b: Secondary | ICD-10-CM

## 2023-11-24 DIAGNOSIS — W19XXXA Unspecified fall, initial encounter: Secondary | ICD-10-CM

## 2023-11-24 DIAGNOSIS — G8929 Other chronic pain: Secondary | ICD-10-CM | POA: Diagnosis not present

## 2023-11-24 NOTE — Progress Notes (Signed)
 HPI  Discussed the use of AI scribe software for clinical note transcription with the patient, who gave verbal consent to proceed.  Hector Simmons "Hector Simmons" is an 83 year old male who presents after a fall with neck pain and concerns about pinworms. He was visiting [place] with his girlfriend when the fall occurred.  He experienced a fall on the kitchen floor when he went around a corner too quickly, resulting in a forward fall. He sustained a minor black eye but has no trouble seeing, blurred vision, headaches, nausea, vomiting, or confusion following the fall.  He has a history of cervical fusions performed in 2011 and is currently experiencing neck pain. The pain is localized to the neck and does not radiate down his arms. He reports limited range of motion in his neck, particularly in flexion, extension, and lateral movements, describing it as 'really tight.' He has taken naprosyn for the neck pain, which he finds more effective than ibuprofen or other NSAIDs.  He mentions concerns about having pinworms, having observed them himself. He is currently using a cleanse recommended by a doctor in [place], whom he has followed for a year. No associated symptoms such as abdominal bloating, pain, or rectal itching.       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 Continue current treatment regimen. Dietary sodium restriction. Regular aerobic exercise. Continue current medications.    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 Continue current treatment regimen.    CN (constipation) 01/22/2014   Last Assessment & Plan:  Start PEG powder every day.  incr water (hard w his situation fo self cathing).      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  Last Assessment & Plan:  Continue current exercise regimen to maximize lower extremity strength.  Discussed PT with patient, but patient reports being able to do exercises on his own as taught.      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  Last Assessment & Plan:  Refer to podiatry one time per year.      Current Outpatient Medications  Medication Sig Dispense Refill   amLODipine (NORVASC) 5 MG tablet Take 1 tablet (5 mg total) by mouth daily. 90 tablet 1   lactulose (CHRONULAC) 10 GM/15ML solution As needed     lisinopril (ZESTRIL) 40 MG tablet Take 1 tablet (40 mg total) by mouth daily. 90 tablet 1   lovastatin (MEVACOR) 40 MG tablet Take 1 tablet (40 mg total) by mouth at bedtime. 90 tablet 1   omeprazole (PRILOSEC) 20 MG capsule Take 1 capsule (20 mg total) by mouth daily. 90 capsule 1   oxybutynin (DITROPAN) 5 MG tablet Take 1 tablet (5 mg total) by mouth 3 (three) times daily. 270 tablet 3   traZODone (DESYREL) 50 MG tablet Take 1 tablet (50 mg total) by mouth at bedtime as needed for sleep. 90 tablet 1   trimethoprim (TRIMPEX) 100 MG tablet Take 1 tablet (100 mg total) by mouth daily. 90 tablet  3   No current facility-administered medications for this visit.    Allergies  Allergen Reactions   No Known Allergies     Family History  Problem Relation Age of Onset   Stroke Father    Heart disease Father     Social History   Socioeconomic History   Marital status: Divorced    Spouse name: Not on file   Number of children: Not on file   Years of education: Not on file   Highest education level: Not on file  Occupational History   Not on file  Tobacco Use   Smoking status: Former   Smokeless tobacco: Never   Substance and Sexual Activity   Alcohol use: Yes    Alcohol/week: 0.0 standard drinks of alcohol   Drug use: No   Sexual activity: Not on file  Other Topics Concern   Not on file  Social History Narrative   Not on file   Social Drivers of Health   Financial Resource Strain: Not on file  Food Insecurity: Not on file  Transportation Needs: Not on file  Physical Activity: Not on file  Stress: Not on file  Social Connections: Not on file  Intimate Partner Violence: Not on file    ROS:  Constitutional: Denies fever, malaise, fatigue, headache or abrupt weight changes.  HEENT: Denies eye pain, eye redness, ear pain, ringing in the ears, wax buildup, runny nose, nasal congestion, bloody nose, or sore throat. Respiratory: Denies difficulty breathing, shortness of breath, cough or sputum production.   Cardiovascular: Denies chest pain, chest tightness, palpitations or swelling in the hands or feet.  Gastrointestinal: Patient reports chronic constipation, worms in the stool.  Denies abdominal pain, bloating, diarrhea or blood in the stool.  GU: Pt reports urinary retention. Denies frequency, urgency, pain with urination, blood in urine, odor or discharge. Musculoskeletal: Patient reports difficulty with gait, neck pain, decrease range of motion.  Denies decrease in range of motion, muscle pain or joint pain and swelling.  Skin: Patient reports bruising to right eye.  Denies redness, rashes, lesions or ulcercations.  Neurological: Patient reports insomnia, neuropathic pain.  Denies dizziness, difficulty with memory, difficulty with speech or problems with coordination.  Psych: Denies anxiety, depression, SI/HI.  No other specific complaints in a complete review of systems (except as listed in HPI above).  PE:  BP 120/62 (BP Location: Left Arm, Patient Position: Sitting, Cuff Size: Normal)   Ht 6\' 1"  (1.854 m)   Wt 208 lb 12.8 oz (94.7 kg)   BMI 27.55 kg/m    Wt Readings from Last  3 Encounters:  10/09/23 203 lb (92.1 kg)  06/22/23 211 lb (95.7 kg)  02/27/23 212 lb (96.2 kg)    General: Appears his stated age, overweight, in NAD. Skin: Contusion noted of the right eye. HEENT: Head: normal shape and size; Eyes: sclera white, no icterus, conjunctiva pink, PERRLA and EOMs intact;  Cardiovascular: Normal rate and rhythm. S1,S2 noted.  No murmur, rubs or gallops noted. No JVD or BLE edema. No carotid bruits noted. Pulmonary/Chest: Normal effort and positive vesicular breath sounds. No respiratory distress. No wheezes, rales or ronchi noted.  Abdomen: Soft and nontender. Normal bowel sounds. Musculoskeletal: Decreased flexion, extension, rotation and lateral bending of the cervical spine secondary to prior fusion.  No bony tenderness noted over the spine.  Ataxic gait with use of cane. Neurological: Alert and oriented.     Assessment and Plan:  Assessment and Plan    Fall with  facial contusion   Minor periorbital contusion from fall. No concussion signs. Bruising expected to resolve spontaneously.   - Monitor for worsening symptoms such as headaches, confusion, nausea, or vomiting and seek urgent care if he occurs.    Neck pain post-cervical fusion   Neck pain post-fall with history of cervical fusion. No immediate hardware issues. X-ray ordered.   - Order cervical spine x-ray to assess the hardware.   - Advise use of naprosyn sparingly for pain management, considering chronic kidney disease.   - Instruct to seek urgent care if neck pain worsens.   - Return next week, Monday through Thursday, for cervical spine x-ray without an appointment.    Chronic kidney disease   Chronic kidney disease requires caution with NSAID use. Naprosyn preferred but advised sparingly.   - Advise caution with NSAID use, particularly Naprosyn, due to chronic kidney disease.    Suspected pinworm infection   Suspected pinworm infection. Discussed albendazole cost and recommended  over-the-counter Reese's pinworm medicine.   - Advise to try over-the-counter Reese's pinworm medicine if the cleanse is ineffective.   - Offer stool sample testing if symptoms persist after trying over-the-counter treatment.   - Follow up with the provider if pinworm symptoms persist after trying over-the-counter treatment.       RTC in 1 month for your annual exam Nicki Reaper, NP

## 2023-11-24 NOTE — Patient Instructions (Signed)
Contusion A contusion is a deep bruise. This is a result of an injury that causes bleeding under the skin. Symptoms of bruising include pain, swelling, and discolored skin. The skin may turn blue, purple, or yellow. Follow these instructions at home: Managing pain, stiffness, and swelling You may use RICE. This stands for: Resting. Icing. Compression, or putting pressure on the injured area. Elevating, or raising the injured area. To follow this method, do these actions: Rest the injured area. If told, put ice on the injured area. To do this: Put ice in a plastic bag. Place a towel between your skin and the bag. Leave the ice on for 20 minutes, 2-3 times per day. If your skin turns bright red, take off the ice right away to prevent skin damage. The risk of skin damage is higher if you cannot feel pain, heat, or cold. If told, apply compression on the injured area using an elastic bandage. Make sure the bandage is not too tight. If the area tingles or has a loss of feeling (numbness), remove it and put it back on as told by your doctor. If possible, elevate the injured area above the level of your heart while you are sitting or lying down.  General instructions Take over-the-counter and prescription medicines only as told by your doctor. Keep all follow-up visits. Your doctor may want to see how your contusion is healing with treatment. Contact a doctor if: Your symptoms do not get better after several days of treatment. Your symptoms get worse. You have trouble moving the injured area. Get help right away if: You have very bad pain. You have a loss of feeling (numbness) in a hand or foot. Your hand or foot turns pale or cold. This information is not intended to replace advice given to you by your health care provider. Make sure you discuss any questions you have with your health care provider. Document Revised: 02/14/2022 Document Reviewed: 02/14/2022 Elsevier Patient Education  2024  [employer].

## 2023-11-30 ENCOUNTER — Ambulatory Visit
Admission: RE | Admit: 2023-11-30 | Discharge: 2023-11-30 | Disposition: A | Discharge status: Home / Self Care | Attending: Internal Medicine | Admitting: Internal Medicine

## 2023-11-30 ENCOUNTER — Ambulatory Visit
Admission: RE | Admit: 2023-11-30 | Discharge: 2023-11-30 | Disposition: A | Discharge status: Home / Self Care | Source: Ambulatory Visit | Attending: Internal Medicine | Admitting: Internal Medicine

## 2023-11-30 DIAGNOSIS — G8929 Other chronic pain: Secondary | ICD-10-CM | POA: Diagnosis present

## 2023-11-30 DIAGNOSIS — M542 Cervicalgia: Secondary | ICD-10-CM | POA: Insufficient documentation

## 2023-12-04 ENCOUNTER — Ambulatory Visit: Payer: Medicare HMO | Admitting: Dermatology

## 2023-12-04 ENCOUNTER — Encounter: Payer: Self-pay | Admitting: Dermatology

## 2023-12-04 DIAGNOSIS — L578 Other skin changes due to chronic exposure to nonionizing radiation: Secondary | ICD-10-CM | POA: Diagnosis not present

## 2023-12-04 DIAGNOSIS — L814 Other melanin hyperpigmentation: Secondary | ICD-10-CM

## 2023-12-04 DIAGNOSIS — L570 Actinic keratosis: Secondary | ICD-10-CM

## 2023-12-04 DIAGNOSIS — Z872 Personal history of diseases of the skin and subcutaneous tissue: Secondary | ICD-10-CM

## 2023-12-04 DIAGNOSIS — Z1283 Encounter for screening for malignant neoplasm of skin: Secondary | ICD-10-CM

## 2023-12-04 DIAGNOSIS — D225 Melanocytic nevi of trunk: Secondary | ICD-10-CM

## 2023-12-04 DIAGNOSIS — W908XXA Exposure to other nonionizing radiation, initial encounter: Secondary | ICD-10-CM

## 2023-12-04 DIAGNOSIS — L821 Other seborrheic keratosis: Secondary | ICD-10-CM

## 2023-12-04 DIAGNOSIS — D229 Melanocytic nevi, unspecified: Secondary | ICD-10-CM

## 2023-12-04 DIAGNOSIS — D692 Other nonthrombocytopenic purpura: Secondary | ICD-10-CM

## 2023-12-04 DIAGNOSIS — L820 Inflamed seborrheic keratosis: Secondary | ICD-10-CM

## 2023-12-04 DIAGNOSIS — D1801 Hemangioma of skin and subcutaneous tissue: Secondary | ICD-10-CM

## 2023-12-04 NOTE — Progress Notes (Signed)
 Follow-Up Visit   Subjective  Hector Simmons is a 83 y.o. male who presents for the following: Upper Body Skin Exam (UBSE) for skin cancer screening and mole check.  The patient has spots, moles and lesions to be evaluated, some may be new or changing. History of Aks. Patient hasn't scheduled PDT or topical treatment for Aks - he fell a few times and went into a depression, would like to hold off for now.  Purple spots on the legs, that come and go, lasting around 2 weeks. Not itchy or painful.  The following portions of the chart were reviewed this encounter and updated as appropriate: medications, allergies, medical history  Review of Systems:  No other skin or systemic complaints except as noted in HPI or Assessment and Plan.  Objective  Well appearing patient in no apparent distress; mood and affect are within normal limits.  A focused examination was performed of the following areas: Face, trunk, extremities, legs  Relevant physical exam findings are noted in the Assessment and Plan.  R hand dorsum x 2, R forearm x 1 (3) Keratotic papules. R spinal upper back x 1 Residual erythematous stuck-on, waxy papule L frontal scalp x 1, R frontal scalp x 1 Pink scaly macules.  Assessment & Plan  Skin cancer screening performed today.   ACTINIC DAMAGE - chronic, secondary to cumulative UV radiation exposure/sun exposure over time - diffuse scaly erythematous macules with underlying dyspigmentation - Recommend daily broad spectrum sunscreen SPF 30+ to sun-exposed areas, reapply every 2 hours as needed.  - Recommend staying in the shade or wearing long sleeves, sun glasses (UVA+UVB protection) and wide brim hats (4-inch brim around the entire circumference of the hat). - Call for new or changing lesions.  Purpura - Chronic; persistent and recurrent.  Treatable, but not curable. - Violaceous macules and patches, arms and legs - Benign - Related to trauma, age, sun damage and/or  use of blood thinners, chronic use of topical and/or oral steroids - Observe - Can use OTC arnica containing moisturizer such as Dermend Bruise Formula if desired - Call for worsening or other concerns - Avoid using mometasone cream to these areas.  MELANOCYTIC NEVI Exam: Tan-brown and/or pink-flesh-colored symmetric macules and papules, including right flank  Treatment Plan: Benign appearing on exam today. Recommend observation. Call clinic for new or changing moles. Recommend daily use of broad spectrum spf 30+ sunscreen to sun-exposed areas.   SEBORRHEIC KERATOSIS - Stuck-on, waxy, tan-brown papules and/or plaques  - Benign-appearing - Discussed benign etiology and prognosis. - Observe - Call for any changes  LENTIGINES Exam: scattered tan macules Due to sun exposure Treatment Plan: Benign-appearing, observe. Recommend daily broad spectrum sunscreen SPF 30+ to sun-exposed areas, reapply every 2 hours as needed.  Call for any changes   HEMANGIOMA Exam: red papule(s) Discussed benign nature. Recommend observation. Call for changes.  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 They are chronic with expected duration over 1 year. A portion of actinic keratoses will progress to squamous cell carcinoma of the skin. It is not possible to reliably predict which spots will progress to skin cancer and so treatment is recommended to prevent development of skin cancer.  Recommend daily broad spectrum sunscreen SPF 30+ to sun-exposed areas, reapply every 2 hours as needed.  Recommend staying in the shade or wearing long sleeves, sun  glasses (UVA+UVB protection) and wide brim hats (4-inch brim around the entire circumference of the hat). Call for new or changing lesions. Destruction of lesion - R hand dorsum x 2, R forearm x 1  (3)  Destruction method: cryotherapy   Informed consent: discussed and consent obtained   Lesion destroyed using liquid nitrogen: Yes   Region frozen until ice ball extended beyond lesion: Yes   Outcome: patient tolerated procedure well with no complications   Post-procedure details: wound care instructions given   Additional details:  Prior to procedure, discussed risks of blister formation, small wound, skin dyspigmentation, or rare scar following cryotherapy. Recommend Vaseline ointment to treated areas while healing.  INFLAMED SEBORRHEIC KERATOSIS R spinal upper back x 1 Symptomatic, irritating, patient would like treated. Destruction of lesion - R spinal upper back x 1  Destruction method: cryotherapy   Informed consent: discussed and consent obtained   Lesion destroyed using liquid nitrogen: Yes   Region frozen until ice ball extended beyond lesion: Yes   Outcome: patient tolerated procedure well with no complications   Post-procedure details: wound care instructions given   Additional details:  Prior to procedure, discussed risks of blister formation, small wound, skin dyspigmentation, or rare scar following cryotherapy. Recommend Vaseline ointment to treated areas while healing.  AK (ACTINIC KERATOSIS) L frontal scalp x 1, R frontal scalp x 1 vs ISKs.  Actinic keratoses are precancerous spots that appear secondary to cumulative UV radiation exposure/sun exposure over time. They are chronic with expected duration over 1 year. A portion of actinic keratoses will progress to squamous cell carcinoma of the skin. It is not possible to reliably predict which spots will progress to skin cancer and so treatment is recommended to prevent development of skin cancer.  Recommend daily broad spectrum sunscreen SPF 30+ to sun-exposed areas, reapply every 2 hours as needed.  Recommend staying in the shade or wearing long sleeves, sun glasses (UVA+UVB protection) and wide brim hats (4-inch brim  around the entire circumference of the hat). Call for new or changing lesions. Destruction of lesion - L frontal scalp x 1, R frontal scalp x 1  Destruction method: cryotherapy   Informed consent: discussed and consent obtained   Lesion destroyed using liquid nitrogen: Yes   Region frozen until ice ball extended beyond lesion: Yes   Outcome: patient tolerated procedure well with no complications   Post-procedure details: wound care instructions given   Additional details:  Prior to procedure, discussed risks of blister formation, small wound, skin dyspigmentation, or rare scar following cryotherapy. Recommend Vaseline ointment to treated areas while healing.    Return in about 6 months (around 06/05/2024) for AKs.  ICherlyn Labella, CMA, am acting as scribe for Willeen Niece, MD .   Documentation: I have reviewed the above documentation for accuracy and completeness, and I agree with the above.  Willeen Niece, MD

## 2023-12-04 NOTE — Patient Instructions (Addendum)

## 2023-12-06 ENCOUNTER — Telehealth: Payer: Self-pay

## 2023-12-06 NOTE — Telephone Encounter (Signed)
 [facility] called in with report for patient. C1 fracture posterior elements, recommends follow up CT C1-C2 articulation.

## 2023-12-07 NOTE — Telephone Encounter (Signed)
 See result note for additional comments and orders

## 2023-12-22 ENCOUNTER — Ambulatory Visit (INDEPENDENT_AMBULATORY_CARE_PROVIDER_SITE_OTHER): Payer: Self-pay | Admitting: Internal Medicine

## 2023-12-22 ENCOUNTER — Encounter: Payer: Self-pay | Admitting: Internal Medicine

## 2023-12-22 ENCOUNTER — Ambulatory Visit (INDEPENDENT_AMBULATORY_CARE_PROVIDER_SITE_OTHER): Payer: Self-pay

## 2023-12-22 VITALS — BP 100/58 | Ht 73.0 in | Wt 203.0 lb

## 2023-12-22 VITALS — BP 120/58 | Ht 73.0 in | Wt 204.6 lb

## 2023-12-22 DIAGNOSIS — E663 Overweight: Secondary | ICD-10-CM | POA: Insufficient documentation

## 2023-12-22 DIAGNOSIS — R7303 Prediabetes: Secondary | ICD-10-CM | POA: Diagnosis not present

## 2023-12-22 DIAGNOSIS — Z0000 Encounter for general adult medical examination without abnormal findings: Secondary | ICD-10-CM

## 2023-12-22 DIAGNOSIS — Z0001 Encounter for general adult medical examination with abnormal findings: Secondary | ICD-10-CM

## 2023-12-22 DIAGNOSIS — Z125 Encounter for screening for malignant neoplasm of prostate: Secondary | ICD-10-CM | POA: Diagnosis not present

## 2023-12-22 DIAGNOSIS — Z6826 Body mass index (BMI) 26.0-26.9, adult: Secondary | ICD-10-CM

## 2023-12-22 DIAGNOSIS — E782 Mixed hyperlipidemia: Secondary | ICD-10-CM

## 2023-12-22 NOTE — Patient Instructions (Signed)
 Health Maintenance After Age 84 After age 4, you are at a higher risk for certain long-term diseases and infections as well as injuries from falls. Falls are a major cause of broken bones and head injuries in people who are older than age 47. Getting regular preventive care can help to keep you healthy and well. Preventive care includes getting regular testing and making lifestyle changes as recommended by your health care provider. Talk with your health care provider about: Which screenings and tests you should have. A screening is a test that checks for a disease when you have no symptoms. A diet and exercise plan that is right for you. What should I know about screenings and tests to prevent falls? Screening and testing are the best ways to find a health problem early. Early diagnosis and treatment give you the best chance of managing medical conditions that are common after age 37. Certain conditions and lifestyle choices may make you more likely to have a fall. Your health care provider may recommend: Regular vision checks. Poor vision and conditions such as cataracts can make you more likely to have a fall. If you wear glasses, make sure to get your prescription updated if your vision changes. Medicine review. Work with your health care provider to regularly review all of the medicines you are taking, including over-the-counter medicines. Ask your health care provider about any side effects that may make you more likely to have a fall. Tell your health care provider if any medicines that you take make you feel dizzy or sleepy. Strength and balance checks. Your health care provider may recommend certain tests to check your strength and balance while standing, walking, or changing positions. Foot health exam. Foot pain and numbness, as well as not wearing proper footwear, can make you more likely to have a fall. Screenings, including: Osteoporosis screening. Osteoporosis is a condition that causes  the bones to get weaker and break more easily. Blood pressure screening. Blood pressure changes and medicines to control blood pressure can make you feel dizzy. Depression screening. You may be more likely to have a fall if you have a fear of falling, feel depressed, or feel unable to do activities that you used to do. Alcohol use screening. Using too much alcohol can affect your balance and may make you more likely to have a fall. Follow these instructions at home: Lifestyle Do not drink alcohol if: Your health care provider tells you not to drink. If you drink alcohol: Limit how much you have to: 0-1 drink a day for women. 0-2 drinks a day for men. Know how much alcohol is in your drink. In the [place] one drink equals one 12 oz bottle of beer (355 mL), one 5 oz glass of wine (148 mL), or one 1 oz glass of hard liquor (44 mL). Do not use any products that contain nicotine or tobacco. These products include cigarettes, chewing tobacco, and vaping devices, such as e-cigarettes. If you need help quitting, ask your health care provider. Activity  Follow a regular exercise program to stay fit. This will help you maintain your balance. Ask your health care provider what types of exercise are appropriate for you. If you need a cane or walker, use it as recommended by your health care provider. Wear supportive shoes that have nonskid soles. Safety  Remove any tripping hazards, such as rugs, cords, and clutter. Install safety equipment such as grab bars in bathrooms and safety rails on stairs. Keep rooms and walkways  well-lit. General instructions Talk with your health care provider about your risks for falling. Tell your health care provider if: You fall. Be sure to tell your health care provider about all falls, even ones that seem minor. You feel dizzy, tiredness (fatigue), or off-balance. Take over-the-counter and prescription medicines only as told by your health care provider. These include  supplements. Eat a healthy diet and maintain a healthy weight. A healthy diet includes low-fat dairy products, low-fat (lean) meats, and fiber from whole grains, beans, and lots of fruits and vegetables. Stay current with your vaccines. Schedule regular health, dental, and eye exams. Summary Having a healthy lifestyle and getting preventive care can help to protect your health and wellness after age 11. Screening and testing are the best way to find a health problem early and help you avoid having a fall. Early diagnosis and treatment give you the best chance for managing medical conditions that are more common for people who are older than age 28. Falls are a major cause of broken bones and head injuries in people who are older than age 48. Take precautions to prevent a fall at home. Work with your health care provider to learn what changes you can make to improve your health and wellness and to prevent falls. This information is not intended to replace advice given to you by your health care provider. Make sure you discuss any questions you have with your health care provider. Document Revised: 01/18/2021 Document Reviewed: 01/18/2021 Elsevier Patient Education  2024 [employer].

## 2023-12-22 NOTE — Assessment & Plan Note (Signed)
 Encouraged diet and exercise for weight loss ?

## 2023-12-22 NOTE — Progress Notes (Signed)
 Subjective:   Nickolai Rinks is a 83 y.o. who presents for a Medicare Wellness preventive visit.  Visit Complete: In person  VideoDeclined- This patient declined Interactive audio and Acupuncturist. Therefore the visit was completed with audio only.  Persons Participating in Visit: Patient.  AWV Questionnaire: No: Patient Medicare AWV questionnaire was not completed prior to this visit.  Cardiac Risk Factors include: advanced age (>58men, >50 women);dyslipidemia;hypertension;male gender     Objective:    Today's Vitals   12/22/23 0957  BP: (!) 100/58  Weight: 203 lb (92.1 kg)  Height: 6\' 1"  (1.854 m)   Body mass index is 26.78 kg/m.     12/22/2023   10:03 AM  Advanced Directives  Does Patient Have a Medical Advance Directive? No  Would patient like information on creating a medical advance directive? No - Patient declined    Current Medications (verified) Outpatient Encounter Medications as of 12/22/2023  Medication Sig   amLODipine (NORVASC) 5 MG tablet Take 1 tablet (5 mg total) by mouth daily.   lactulose (CHRONULAC) 10 GM/15ML solution As needed   lisinopril (ZESTRIL) 40 MG tablet Take 1 tablet (40 mg total) by mouth daily.   lovastatin (MEVACOR) 40 MG tablet Take 1 tablet (40 mg total) by mouth at bedtime.   omeprazole (PRILOSEC) 20 MG capsule Take 1 capsule (20 mg total) by mouth daily.   oxybutynin (DITROPAN) 5 MG tablet Take 1 tablet (5 mg total) by mouth 3 (three) times daily.   traZODone (DESYREL) 50 MG tablet Take 1 tablet (50 mg total) by mouth at bedtime as needed for sleep.   trimethoprim (TRIMPEX) 100 MG tablet Take 1 tablet (100 mg total) by mouth daily.   No facility-administered encounter medications on file as of 12/22/2023.    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 Continue current treatment regimen. Dietary sodium restriction. Regular aerobic exercise. Continue current medications.    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 Continue current treatment regimen.    CN (constipation) 01/22/2014   Last Assessment & Plan:  Start PEG powder every day.  incr water (hard w his situation fo self cathing).     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  Last Assessment & Plan:  Continue current exercise regimen to maximize lower extremity strength.  Discussed PT with patient, but patient reports being able to do exercises on his own as taught.      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  Last Assessment & Plan:  Refer to podiatry one time per year.     Past Surgical History:  Procedure Laterality Date   BACK SURGERY     REPLACEMENT TOTAL KNEE Bilateral    Family History  Problem Relation Age of Onset   Stroke Father    Heart disease Father    Social History   Socioeconomic History   Marital status: Divorced    Spouse  name: Not on file   Number of children: Not on file   Years of education: Not on file   Highest education level: Not on file  Occupational History   Not on file  Tobacco Use   Smoking status: Former   Smokeless tobacco: Never  Substance and Sexual Activity   Alcohol use: Yes    Alcohol/week: 0.0 standard drinks of alcohol   Drug use: No   Sexual activity: Not on file  Other Topics Concern   Not on file  Social History Narrative   Not on file   Social Drivers of Health   Financial  Resource Strain: Low Risk  (12/22/2023)   Overall Financial Resource Strain (CARDIA)    Difficulty of Paying Living Expenses: Not hard at all  Food Insecurity: No Food Insecurity (12/22/2023)   Hunger Vital Sign    Worried About Running Out of Food in the Last Year: Never true    Ran Out of Food in the Last Year: Never true  Transportation Needs: No Transportation Needs (12/22/2023)   PRAPARE - Administrator, Civil Service (Medical): No    Lack of Transportation (Non-Medical): No  Physical Activity: Sufficiently Active (12/22/2023)   Exercise Vital Sign    Days of Exercise per Week: 4 days    Minutes of Exercise per Session: 60 min  Stress: No Stress Concern Present (12/22/2023)   [employer] of Occupational Health - Occupational Stress Questionnaire    Feeling of Stress : Only a little  Social Connections: Moderately Isolated (12/22/2023)   Social Connection and Isolation Panel [NHANES]    Frequency of Communication with Friends and Family: More than three times a week    Frequency of Social Gatherings with Friends and Family: Twice a week    Attends Religious Services: More than 4 times per year    Active Member of [employer] or Organizations: No    Attends Engineer, structural: Never    Marital Status: Divorced    Tobacco Counseling Counseling given: Not Answered    Clinical Intake:  Pre-visit preparation completed: Yes  Pain : No/denies pain     BMI - recorded: 26.78 Nutritional Status: BMI 25 -29 Overweight Nutritional Risks: None Diabetes: No  Lab Results  Component Value Date   HGBA1C 5.6 06/22/2023     How often do you need to have someone help you when you read instructions, pamphlets, or other written materials from your doctor or pharmacy?: 1 - Never  Interpreter Needed?: No  Information entered by :: Kennedy Bucker, LPN   Activities of Daily Living    12/22/2023   10:05 AM  In your present state of health, do you have any  difficulty performing the following activities:  Hearing? 0  Vision? 0  Difficulty concentrating or making decisions? 0  Walking or climbing stairs? 1  Dressing or bathing? 0  Doing errands, shopping? 0  Preparing Food and eating ? N  Using the Toilet? N  In the past six months, have you accidently leaked urine? N  Do you have problems with loss of bowel control? N  Managing your Medications? N  Managing your Finances? N  Housekeeping or managing your Housekeeping? N    Patient Care Team: Lorre Munroe, NP as PCP - General (Internal Medicine) [facility], [facility] (Optometry)  Indicate any recent Medical Services you may have received from other than [facility] providers in the past year (date may be approximate).     Assessment:  This is a routine wellness examination for Severance.  Hearing/Vision screen Hearing Screening - Comments:: NO AIDS Vision Screening - Comments:: WEARS GLASSES ALL DAY- [facility]   Goals Addressed             This Visit's Progress    DIET - EAT MORE FRUITS AND VEGETABLES         Depression Screen     12/22/2023   10:01 AM 12/22/2023    9:00 AM 11/24/2023    8:06 AM  PHQ 2/9 Scores  PHQ - 2 Score 1 0 0  PHQ- 9 Score 1 0     Fall Risk     12/22/2023   10:04 AM 12/22/2023    8:59 AM 11/24/2023    8:06 AM  Fall Risk   Falls in the past year? 1 1 1   Number falls in past yr: 1 1 1   Injury with Fall? 1 1 1   Risk for fall due to : History of fall(s);Impaired mobility    Follow up Falls evaluation completed;Falls prevention discussed      MEDICARE RISK AT HOME:  Medicare Risk at Home Any stairs in or around the home?: No If so, are there any without handrails?: No Home free of loose throw rugs in walkways, pet beds, electrical cords, etc?: Yes Adequate lighting in your home to reduce risk of falls?: Yes Life alert?: No Use of a cane, walker or w/c?: Yes (CANE) Grab bars in the bathroom?: Yes Shower chair or bench in shower?:  No Elevated toilet seat or a handicapped toilet?: No  TIMED UP AND GO:  Was the test performed?  Yes  Length of time to ambulate 10 feet: 6 sec Gait slow and steady with assistive device  Cognitive Function: 6CIT completed        12/22/2023   10:06 AM  6CIT Screen  What Year? 0 points  What month? 0 points  What time? 0 points  Count back from 20 0 points  Months in reverse 0 points  Repeat phrase 0 points  Total Score 0 points    Immunizations Immunization History  Administered Date(s) Administered   Fluad Trivalent(High Dose 65+) 06/22/2023   Influenza, High Dose Seasonal PF 07/12/2016, 06/28/2017, 07/03/2018, 06/24/2019, 07/08/2020, 06/22/2021, 06/16/2022   Moderna Covid-19 Vaccine Bivalent Booster 55yrs & up 11/11/2021   Moderna Sars-Covid-2 Vaccination 10/09/2019, 11/06/2019, 07/17/2020, 02/18/2021   Pneumococcal Conjugate-13 08/26/2013, 07/12/2016   Pneumococcal Polysaccharide-23 04/12/2012   Td (Adult),5 Lf Tetanus Toxid, Preservative Free 09/25/2015   Td (Adult),unspecified 11/02/1996   Tdap 10/19/2020   Zoster Recombinant(Shingrix) 10/11/2022    Screening Tests Health Maintenance  Topic Date Due   COVID-19 Vaccine (6 - 2024-25 season) 01/07/2024 (Originally 05/14/2023)   Zoster Vaccines- Shingrix (2 of 2) 03/22/2024 (Originally 12/06/2022)   INFLUENZA VACCINE  04/12/2024   Medicare Annual Wellness (AWV)  12/21/2024   DTaP/Tdap/Td (2 - Td or Tdap) 10/19/2030   Pneumonia Vaccine 22+ Years old  Completed   HPV VACCINES  Aged Out   Meningococcal B Vaccine  Aged Out    Health Maintenance  There are no preventive care reminders to display for this patient. Health Maintenance Items Addressed: UP TO DATE ON SHOTS  Additional Screening:  Vision Screening: Recommended annual ophthalmology exams for early detection of glaucoma and other disorders of the eye.  Dental Screening: Recommended annual dental exams for proper oral hygiene  Community Resource  Referral / Chronic Care Management: CRR required this visit?  No   CCM  required this visit?  No     Plan:     I have personally reviewed and noted the following in the patient's chart:   Medical and social history Use of alcohol, tobacco or illicit drugs  Current medications and supplements including opioid prescriptions. Patient is not currently taking opioid prescriptions. Functional ability and status Nutritional status Physical activity Advanced directives List of other physicians Hospitalizations, surgeries, and ER visits in previous 12 months Vitals Screenings to include cognitive, depression, and falls Referrals and appointments  In addition, I have reviewed and discussed with patient certain preventive protocols, quality metrics, and best practice recommendations. A written personalized care plan for preventive services as well as general preventive health recommendations were provided to patient.     LEVONTE MOLINA, LPN   1/61/0960   After Visit Summary: (In Person-Declined) Patient declined AVS at this time.  Notes: Nothing significant to report at this time.

## 2023-12-22 NOTE — Patient Instructions (Addendum)
 Mr. Hector Simmons , Thank you for taking time to come for your Medicare Wellness Visit. I appreciate your ongoing commitment to your health goals. Please review the following plan we discussed and let me know if I can assist you in the future.   Referrals/Orders/Follow-Ups/Clinician Recommendations: NONE  This is a list of the screening recommended for you and due dates:  Health Maintenance  Topic Date Due   COVID-19 Vaccine (6 - 2024-25 season) 01/07/2024*   Zoster (Shingles) Vaccine (2 of 2) 03/22/2024*   Flu Shot  04/12/2024   Medicare Annual Wellness Visit  12/21/2024   DTaP/Tdap/Td vaccine (2 - Td or Tdap) 10/19/2030   Pneumonia Vaccine  Completed   HPV Vaccine  Aged Out   Meningitis B Vaccine  Aged Out  *Topic was postponed. The date shown is not the original due date.    Advanced directives: (ACP Link)Information on Advanced Care Planning can be found at [facility] of [place] Advance Health Care Directives Advance Health Care Directives. [email or web address]   Next Medicare Annual Wellness Visit scheduled for next year: Yes  12/27/24 @ 9:30 AM IN PERSON

## 2023-12-22 NOTE — Progress Notes (Signed)
 Subjective:    Patient ID: Hector Simmons, male    DOB: May 28, 1941, 83 y.o.   MRN: [id number]  HPI  Patient presents to clinic today for his annual exam.  Flu: 06/2023 Tetanus: 10/2020 COVID: X 5 Pneumovax: 04/2012 Prevnar 13: 06/2016 Shingrix: X 2 PSA screening: >2 years ago Colon screening: No longer screening Vision screening: annually Dentist: biannually  Diet: He does eat meat. He consumes fruits and veggies. He does eat fried foods. 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 Continue current treatment regimen. Dietary sodium restriction. Regular aerobic exercise. Continue current medications.    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 Continue current treatment regimen.    CN (constipation) 01/22/2014   Last Assessment & Plan:  Start PEG powder every day.  incr water (hard w his situation fo self cathing).     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  Last Assessment & Plan:  Continue current exercise regimen to maximize lower extremity strength.  Discussed PT with patient, but patient reports being able to do exercises on his own as taught.      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  Last Assessment & Plan:  Refer to podiatry one time per year.      Current Outpatient Medications  Medication Sig Dispense Refill   amLODipine (NORVASC) 5 MG tablet  Take 1 tablet (5 mg total) by mouth daily. 90 tablet 1   lactulose (CHRONULAC) 10 GM/15ML solution As needed     lisinopril (ZESTRIL) 40 MG tablet Take 1 tablet (40 mg total) by mouth daily. 90 tablet 1   lovastatin (MEVACOR) 40 MG tablet Take 1 tablet (40 mg total) by mouth at bedtime. 90 tablet 1   omeprazole (PRILOSEC) 20 MG capsule Take 1 capsule (20 mg total) by mouth daily. 90 capsule 1   oxybutynin (DITROPAN) 5 MG tablet Take 1 tablet (5 mg total) by mouth 3 (three) times daily. 270 tablet 3   traZODone (DESYREL) 50 MG tablet Take 1 tablet (50 mg total) by mouth at bedtime as needed for sleep. 90 tablet 1   trimethoprim (TRIMPEX) 100 MG tablet Take 1 tablet (100 mg total) by mouth daily. 90 tablet 3   No current facility-administered medications for this visit.    Allergies  Allergen Reactions   No Known Allergies     Family History  Problem Relation Age of Onset   Stroke Father    Heart disease Father     Social History   Socioeconomic History   Marital status: Divorced    Spouse name: Not on file   Number of children: Not on file   Years of education: Not on file   Highest education level: Not on file  Occupational History   Not on file  Tobacco Use   Smoking status: Former   Smokeless tobacco: Never  Substance and Sexual  Activity   Alcohol use: Yes    Alcohol/week: 0.0 standard drinks of alcohol   Drug use: No   Sexual activity: Not on file  Other Topics Concern   Not on file  Social History Narrative   Not on file   Social Drivers of Health   Financial Resource Strain: Not on file  Food Insecurity: Not on file  Transportation Needs: Not on file  Physical Activity: Not on file  Stress: Not on file  Social Connections: Not on file  Intimate Partner Violence: Not on file     Constitutional: Denies fever, malaise, fatigue, headache or abrupt weight changes.  HEENT: Denies eye pain, eye redness, ear pain, ringing in the ears, wax buildup, runny nose,  nasal congestion, bloody nose, or sore throat. Respiratory: Denies difficulty breathing, shortness of breath, cough or sputum production.   Cardiovascular: Denies chest pain, chest tightness, palpitations or swelling in the hands or feet.  Gastrointestinal: Pt reports constipation. Denies abdominal pain, bloating, diarrhea or blood in the stool.  GU: Denies urgency, frequency, pain with urination, burning sensation, blood in urine, odor or discharge. Musculoskeletal: Patient reports difficulty with gait.  Denies decrease in range of motion, muscle pain or joint pain and swelling.  Skin: Denies redness, rashes, lesions or ulcercations.  Neurological: Patient reports neuropathic pain, difficulty with balance.  Denies dizziness, difficulty with memory, difficulty with speech or problems coordination.  Psych: Denies anxiety, depression, SI/HI.  No other specific complaints in a complete review of systems (except as listed in HPI above).      Objective:   Physical Exam  BP (!) 120/58 (BP Location: Left Arm, Patient Position: Sitting, Cuff Size: Normal)   Ht 6\' 1"  (1.854 m)   Wt 204 lb 9.6 oz (92.8 kg)   BMI 26.99 kg/m   Wt Readings from Last 3 Encounters:  11/24/23 208 lb 12.8 oz (94.7 kg)  10/09/23 203 lb (92.1 kg)  06/22/23 211 lb (95.7 kg)    General: Appears his stated age, overweight, in NAD. Skin: Warm, dry and intact.  Senile purpura noted of BUE. HEENT: Head: normal shape and size; Eyes: sclera white, no icterus, conjunctiva pink, PERRLA and EOMs intact;  Neck:  Neck supple, trachea midline. No masses, lumps or thyromegaly present.  Cardiovascular: Normal rate and rhythm. S1,S2 noted.  No murmur, rubs or gallops noted. No JVD or BLE edema. No carotid bruits noted. Pulmonary/Chest: Normal effort and positive vesicular breath sounds. No respiratory distress. No wheezes, rales or ronchi noted.  Abdomen: Soft and nontender. Normal bowel sounds. No distention or masses  noted. Musculoskeletal: Strength 5/5 BUE/BLE. Gait slow and slightly unsteady with use of cane. Neurological: Alert and oriented. Cranial nerves II-XII grossly intact.  Decreased sensation of hands and feet with monofilament testing.  Coordination normal.  Psychiatric: Mood and affect normal. Behavior is normal. Judgment and thought content normal.    BMET No results found for: "NA", "K", "CL", "CO2", "GLUCOSE", "BUN", "CREATININE", "CALCIUM", "GFRNONAA", "GFRAA"  Lipid Panel     Component Value Date/Time   CHOL 129 06/22/2023 1027   TRIG 79 06/22/2023 1027   HDL 56 06/22/2023 1027   CHOLHDL 2.3 06/22/2023 1027   LDLCALC 57 06/22/2023 1027    CBC    Component Value Date/Time   WBC 7.0 06/22/2023 1027   RBC 3.12 (L) 06/22/2023 1027   HGB 10.1 (L) 06/22/2023 1027   HCT 31.0 (L) 06/22/2023 1027   PLT 200 06/22/2023 1027   MCV 99.4 06/22/2023 1027  MCH 32.4 06/22/2023 1027   MCHC 32.6 06/22/2023 1027   RDW 13.9 06/22/2023 1027    Hgb A1C Lab Results  Component Value Date   HGBA1C 5.6 06/22/2023            Assessment & Plan:   Preventative health maintenance:  Encouraged him to get a flu shot in the fall Tetanus UTD Encouraged him to get his COVID booster Pneumovax and Prevnar 13 UTD He declines Prevnar 20 today Discussed Shingrix vaccine, he will check coverage with his insurance company and schedule visit if he would like to have this done He no longer needs to screen for colon cancer Encouraged him to consume a balanced diet and exercise regimen Advised him to see an eye doctor and dentist annually We will check CBC, c-Met, lipid, A1c and PSA today  RTC in 6 months, follow-up chronic conditions Nicki Reaper, NP

## 2023-12-23 ENCOUNTER — Encounter: Payer: Self-pay | Admitting: Internal Medicine

## 2023-12-23 DIAGNOSIS — E875 Hyperkalemia: Secondary | ICD-10-CM

## 2023-12-23 DIAGNOSIS — N1832 Chronic kidney disease, stage 3b: Secondary | ICD-10-CM

## 2023-12-23 DIAGNOSIS — N184 Chronic kidney disease, stage 4 (severe): Secondary | ICD-10-CM

## 2023-12-23 LAB — CBC
HCT: 30.9 % — ABNORMAL LOW (ref 38.5–50.0)
Hemoglobin: 10.1 g/dL — ABNORMAL LOW (ref 13.2–17.1)
MCH: 32.2 pg (ref 27.0–33.0)
MCHC: 32.7 g/dL (ref 32.0–36.0)
MCV: 98.4 fL (ref 80.0–100.0)
MPV: 9.9 fL (ref 7.5–12.5)
Platelets: 200 10*3/uL (ref 140–400)
RBC: 3.14 10*6/uL — ABNORMAL LOW (ref 4.20–5.80)
RDW: 14.5 % (ref 11.0–15.0)
WBC: 7.3 10*3/uL (ref 3.8–10.8)

## 2023-12-23 LAB — COMPREHENSIVE METABOLIC PANEL WITH GFR
AG Ratio: 2 (calc) (ref 1.0–2.5)
ALT: 15 U/L (ref 9–46)
AST: 16 U/L (ref 10–35)
Albumin: 4.4 g/dL (ref 3.6–5.1)
Alkaline phosphatase (APISO): 71 U/L (ref 35–144)
BUN/Creatinine Ratio: 19 (calc) (ref 6–22)
BUN: 37 mg/dL — ABNORMAL HIGH (ref 7–25)
CO2: 21 mmol/L (ref 20–32)
Calcium: 9.2 mg/dL (ref 8.6–10.3)
Chloride: 108 mmol/L (ref 98–110)
Creat: 1.99 mg/dL — ABNORMAL HIGH (ref 0.70–1.22)
Globulin: 2.2 g/dL (ref 1.9–3.7)
Glucose, Bld: 113 mg/dL (ref 65–139)
Potassium: 5.8 mmol/L — ABNORMAL HIGH (ref 3.5–5.3)
Sodium: 137 mmol/L (ref 135–146)
Total Bilirubin: 0.5 mg/dL (ref 0.2–1.2)
Total Protein: 6.6 g/dL (ref 6.1–8.1)
eGFR: 33 mL/min/{1.73_m2} — ABNORMAL LOW (ref >=60)

## 2023-12-23 LAB — HEMOGLOBIN A1C
Hgb A1c MFr Bld: 5.7 %{Hb} — ABNORMAL HIGH (ref <5.7)
Mean Plasma Glucose: 117 mg/dL
eAG (mmol/L): 6.5 mmol/L

## 2023-12-23 LAB — LIPID PANEL
Cholesterol: 141 mg/dL (ref <200)
HDL: 60 mg/dL (ref >=40)
LDL Cholesterol (Calc): 65 mg/dL
Non-HDL Cholesterol (Calc): 81 mg/dL (ref <130)
Total CHOL/HDL Ratio: 2.4 (calc) (ref <5.0)
Triglycerides: 75 mg/dL (ref <150)

## 2023-12-23 LAB — PSA: PSA: 1.48 ng/mL (ref <=4.00)

## 2023-12-29 ENCOUNTER — Ambulatory Visit: Payer: Self-pay

## 2023-12-29 ENCOUNTER — Telehealth: Payer: Self-pay | Admitting: Internal Medicine

## 2023-12-29 NOTE — Telephone Encounter (Signed)
  Closing please see nurse triage.

## 2023-12-29 NOTE — Telephone Encounter (Signed)
 Reason for Disposition  [1] Follow-up call from patient regarding patient's clinical status AND [2] information NON-URGENT  Answer Assessment - Initial Assessment Questions 1. REASON FOR CALL or QUESTION: What is your reason for calling today? or How can I best help you? or What question do you have that I can help answer?     The patient was seen on 12/22/23. He states his NP Angeline Laura advised him to take Miralax and that he should be having 2 bowel movements weekly. He states he had been constipated so he was taking the Miralax for the past 7 days. He states he had a bowel movement last night. He would like to know does he need to continue taking Miralax and if so, how long would he continue on it. Patient states he did not see any information on this in his visit paperwork. Patient states for now he will continue taking it. Reviewed visit note from 12/22/23, unable to locate any mention of constipation/Miralax/plan of care. 2. CALLER: Document the source of call. (e.g., laboratory, patient).     Patient.  Protocols used: PCP Call - No Triage-A-AH

## 2023-12-29 NOTE — Telephone Encounter (Signed)
 Copied from CRM [id number]. Topic: Clinical - Medication Question >> Dec 29, 2023  1:58 PM Baldemar Lev wrote: Reason for CRM: Pt has questions about his miralax  Best contact: [phone]

## 2023-12-29 NOTE — Telephone Encounter (Signed)
 Called patient back. He said he is taking Miralax 1 cap daily, and it took 7 days to have 1 BM. He feels fine at this time and thought about increasing it. I advised him that he can increase and try intermittent dosing or adjust the daily dose until he reaches desired BMs. If his goal is 2 BM per week, he can try 1 cap one day then 2 cap next day and alternate and if need can eventually adjust up to 2 caps. Gave reassurance that it is safe to go up on dose and adjust accordingly.  Domingo Friend, DO [facility] 12/29/2023, 5:02 PM

## 2024-01-01 ENCOUNTER — Other Ambulatory Visit: Payer: Self-pay

## 2024-01-01 MED ORDER — MOMETASONE FUROATE 0.1 % EX SOLN: Freq: Every day | CUTANEOUS | 0 refills | Status: DC

## 2024-01-01 NOTE — Progress Notes (Signed)
Fax received requesting refills

## 2024-01-04 ENCOUNTER — Other Ambulatory Visit: Payer: Self-pay

## 2024-01-04 MED ORDER — MOMETASONE FUROATE 0.1 % EX SOLN: Freq: Every day | CUTANEOUS | 0 refills | Status: DC

## 2024-01-04 NOTE — Progress Notes (Signed)
 Pharmacy requested clarification so new prescription sent with detailed instructions for use.

## 2024-01-09 ENCOUNTER — Other Ambulatory Visit

## 2024-01-09 DIAGNOSIS — N1832 Chronic kidney disease, stage 3b: Secondary | ICD-10-CM

## 2024-01-09 DIAGNOSIS — E875 Hyperkalemia: Secondary | ICD-10-CM

## 2024-01-09 LAB — BASIC METABOLIC PANEL WITH GFR
BUN/Creatinine Ratio: 15 (calc) (ref 6–22)
BUN: 35 mg/dL — ABNORMAL HIGH (ref 7–25)
CO2: 24 mmol/L (ref 20–32)
Calcium: 9.1 mg/dL (ref 8.6–10.3)
Chloride: 107 mmol/L (ref 98–110)
Creat: 2.26 mg/dL — ABNORMAL HIGH (ref 0.70–1.22)
Glucose, Bld: 102 mg/dL (ref 65–139)
Potassium: 5.6 mmol/L — ABNORMAL HIGH (ref 3.5–5.3)
Sodium: 137 mmol/L (ref 135–146)
eGFR: 28 mL/min/{1.73_m2} — ABNORMAL LOW (ref >=60)

## 2024-01-10 ENCOUNTER — Telehealth: Payer: Self-pay

## 2024-01-10 NOTE — Telephone Encounter (Signed)
 Copied from CRM [id number]. Topic: General - Other >> Jan 10, 2024  9:05 AM Kevelyn M wrote: Reason for CRM:  pt stated that he was calling the office back. He had just spoken to some in the office a half hour ago about potassium foods. I was unable to locate an encounter for this call. Please call patient back.

## 2024-01-10 NOTE — Addendum Note (Signed)
 Addended by: Carollynn Cirri on: 01/10/2024 09:52 AM   Modules accepted: Orders

## 2024-01-20 ENCOUNTER — Other Ambulatory Visit: Payer: Self-pay | Admitting: Internal Medicine

## 2024-01-22 NOTE — Telephone Encounter (Signed)
 Requested Prescriptions  Pending Prescriptions Disp Refills   traZODone  (DESYREL ) 50 MG tablet [Pharmacy Med Name: traZODone  HCl Oral Tablet 50 MG] 90 tablet 1    Sig: TAKE 1 TABLET AT BEDTIME AS NEEDED FOR SLEEP     Psychiatry: Antidepressants - Serotonin Modulator Passed - 01/22/2024  4:16 PM      Passed - Valid encounter within last 6 months    Recent Outpatient Visits           1 month ago Encounter for general adult medical examination with abnormal findings   [place] [facility] [place], Rankin Buzzard, NP   1 month ago Fall, initial encounter   [place] [facility] [place], Rankin Buzzard, NP       Future Appointments             In 1 month MacDiarmid, Geralyn Knee, MD [facility] [place]   In 4 months Artemio Larry, MD [facility]             amLODipine  (NORVASC ) 5 MG tablet [Pharmacy Med Name: amLODIPine  Besylate Oral Tablet 5 MG] 90 tablet 1    Sig: TAKE 1 TABLET EVERY DAY     Cardiovascular: Calcium Channel Blockers 2 Passed - 01/22/2024  4:16 PM      Passed - Last BP in normal range    BP Readings from Last 1 Encounters:  12/22/23 (!) 100/58         Passed - Last Heart Rate in normal range    Pulse Readings from Last 1 Encounters:  10/09/23 96         Passed - Valid encounter within last 6 months    Recent Outpatient Visits           1 month ago Encounter for general adult medical examination with abnormal findings   [place] [facility] [place], [place], NP   1 month ago Fall, initial encounter   [place] [place], Rankin Buzzard, NP       Future Appointments             In 1 month MacDiarmid, Geralyn Knee, MD [facility] [place]   In 4 months Artemio Larry, MD [facility]             lovastatin  (MEVACOR ) 40 MG tablet [Pharmacy Med Name: Lovastatin  Oral Tablet 40 MG] 90 tablet 1    Sig: TAKE 1 TABLET AT BEDTIME      Cardiovascular:  Antilipid - Statins 2 Failed - 01/22/2024  4:16 PM      Failed - Cr in normal range and within 360 days    Creat  Date Value Ref Range Status  01/09/2024 2.26 (H) 0.70 - 1.22 mg/dL Final         Failed - Lipid Panel in normal range within the last 12 months    Cholesterol  Date Value Ref Range Status  12/22/2023 141 <200 mg/dL Final   LDL Cholesterol (Calc)  Date Value Ref Range Status  12/22/2023 65 mg/dL (calc) Final    Comment:    Reference range: <100 . Desirable range <100 mg/dL for primary prevention;   <70 mg/dL for patients with CHD or diabetic patients  with > or = 2 CHD risk factors. Aaron Aas LDL-C is now calculated using the Martin-Hopkins  calculation, which is a validated novel method providing  better accuracy than the Friedewald equation in the  estimation of LDL-C.  Melinda Sprawls et al. Erroll Heard. 4098;119(14): 2061-2068  (http://education.[employer].com/faq/FAQ164)    HDL  Date Value Ref Range Status  12/22/2023 60 > OR = 40 mg/dL Final   Triglycerides  Date Value Ref Range Status  12/22/2023 75 <150 mg/dL Final         Passed - Patient is not pregnant      Passed - Valid encounter within last 12 months    Recent Outpatient Visits           1 month ago Encounter for general adult medical examination with abnormal findings   [facility] [place], Rankin Buzzard, NP   1 month ago Fall, initial encounter   [place] [place], Rankin Buzzard, NP       Future Appointments             In 1 month MacDiarmid, Geralyn Knee, MD [facility] [place]   In 4 months Artemio Larry, MD [facility]             omeprazole  (PRILOSEC) 20 MG capsule [Pharmacy Med Name: Omeprazole  Oral Capsule Delayed Release 20 MG] 90 capsule 1    Sig: TAKE 1 CAPSULE EVERY DAY     Gastroenterology: Proton Pump Inhibitors Passed - 01/22/2024  4:16 PM      Passed - Valid encounter within last 12 months     Recent Outpatient Visits           1 month ago Encounter for general adult medical examination with abnormal findings   [facility] [place], [place], NP   1 month ago Fall, initial encounter   [facility] [place], Rankin Buzzard, NP       Future Appointments             In 1 month MacDiarmid, Geralyn Knee, MD [facility] [place]   In 4 months Artemio Larry, MD [facility]             lisinopril  (ZESTRIL ) 40 MG tablet [Pharmacy Med Name: Lisinopril  Oral Tablet 40 MG] 90 tablet 1    Sig: TAKE 1 TABLET EVERY DAY     Cardiovascular:  ACE Inhibitors Failed - 01/22/2024  4:16 PM      Failed - Cr in normal range and within 180 days    Creat  Date Value Ref Range Status  01/09/2024 2.26 (H) 0.70 - 1.22 mg/dL Final         Failed - K in normal range and within 180 days    Potassium  Date Value Ref Range Status  01/09/2024 5.6 (H) 3.5 - 5.3 mmol/L Final         Passed - Patient is not pregnant      Passed - Last BP in normal range    BP Readings from Last 1 Encounters:  12/22/23 (!) 100/58         Passed - Valid encounter within last 6 months    Recent Outpatient Visits           1 month ago Encounter for general adult medical examination with abnormal findings    [facility] [place], Rankin Buzzard, NP   1 month ago Fall, initial encounter   [facility] [place], Rankin Buzzard, NP       Future Appointments  In 1 month MacDiarmid, Geralyn Knee, MD [facility] [place]   In 4 months Artemio Larry, MD [facility]

## 2024-02-06 ENCOUNTER — Other Ambulatory Visit: Payer: Self-pay | Admitting: Nephrology

## 2024-02-06 ENCOUNTER — Telehealth: Payer: Self-pay

## 2024-02-06 ENCOUNTER — Ambulatory Visit: Payer: Self-pay | Admitting: *Deleted

## 2024-02-06 DIAGNOSIS — N1832 Chronic kidney disease, stage 3b: Secondary | ICD-10-CM

## 2024-02-06 DIAGNOSIS — D631 Anemia in chronic kidney disease: Secondary | ICD-10-CM

## 2024-02-06 DIAGNOSIS — R8290 Unspecified abnormal findings in urine: Secondary | ICD-10-CM

## 2024-02-06 NOTE — Telephone Encounter (Signed)
 Copied from CRM [id number]. Topic: Clinical - Medical Advice >> Feb 06, 2024  3:14 PM Chrystal Crape R wrote: Pt needs help with checking his blood pressure as his machine is not working

## 2024-02-06 NOTE — Telephone Encounter (Signed)
 Opened chart to answer a question for the agent.   Pt was calling back for Amber in the office.   So call forwarded to [facility].

## 2024-02-06 NOTE — Telephone Encounter (Signed)
 Patient called in stating that he saw his nephrologist whom advised him to check his BP regularly. He is concerned about an error code he keeps getting when trying to check his BP. Advised him that he may need to purchase a new BP machine if he continues to receive a code. Verbalized understanding. Patient is asking about a condition related to his CBC/hemoglobin. He was unaware of how to pronounce it or spell it. He kept saying Hemoglobina. Any need for concerns with patient's labs prior? Anything to follow up on? Thanks!

## 2024-02-07 NOTE — Telephone Encounter (Signed)
 Patient notified of all instructions taking iron daily and he stated he got his BP monitor working.

## 2024-02-07 NOTE — Telephone Encounter (Signed)
 Agree with recommendation to purchase new blood pressure cuff if continues to get air code.  He may also need to just try changing the batteries.  He has a slight iron deficiency anemia.  He should be taking oral iron 325 mg daily.

## 2024-02-14 ENCOUNTER — Other Ambulatory Visit: Payer: Self-pay

## 2024-02-14 ENCOUNTER — Telehealth: Payer: Self-pay | Admitting: Urology

## 2024-02-14 DIAGNOSIS — N319 Neuromuscular dysfunction of bladder, unspecified: Secondary | ICD-10-CM

## 2024-02-14 MED ORDER — TRIMETHOPRIM 100 MG PO TABS: 100.0000 mg | ORAL_TABLET | Freq: Every day | ORAL | 3 refills | Status: AC

## 2024-02-14 NOTE — Telephone Encounter (Signed)
 Refill sent, pt was informed, voiced understanding.

## 2024-02-14 NOTE — Telephone Encounter (Signed)
 It is his Trimethoprim .

## 2024-02-14 NOTE — Telephone Encounter (Signed)
 Pt called and needs his prescription that has expired refilled. He uses [facility]. He can be reached at [phone]

## 2024-02-19 ENCOUNTER — Ambulatory Visit
Admission: RE | Admit: 2024-02-19 | Discharge: 2024-02-19 | Disposition: A | Discharge status: Home / Self Care | Source: Ambulatory Visit | Attending: Nephrology | Admitting: Nephrology

## 2024-02-19 DIAGNOSIS — R8290 Unspecified abnormal findings in urine: Secondary | ICD-10-CM | POA: Insufficient documentation

## 2024-02-19 DIAGNOSIS — D631 Anemia in chronic kidney disease: Secondary | ICD-10-CM | POA: Insufficient documentation

## 2024-02-19 DIAGNOSIS — N1832 Chronic kidney disease, stage 3b: Secondary | ICD-10-CM | POA: Insufficient documentation

## 2024-02-26 ENCOUNTER — Ambulatory Visit: Payer: Self-pay

## 2024-02-26 ENCOUNTER — Ambulatory Visit (INDEPENDENT_AMBULATORY_CARE_PROVIDER_SITE_OTHER): Payer: Self-pay | Admitting: Urology

## 2024-02-26 VITALS — BP 130/63 | HR 96 | Ht 73.0 in | Wt 206.0 lb

## 2024-02-26 DIAGNOSIS — N319 Neuromuscular dysfunction of bladder, unspecified: Secondary | ICD-10-CM | POA: Diagnosis not present

## 2024-02-26 DIAGNOSIS — N1330 Unspecified hydronephrosis: Secondary | ICD-10-CM

## 2024-02-26 DIAGNOSIS — R339 Retention of urine, unspecified: Secondary | ICD-10-CM | POA: Diagnosis not present

## 2024-02-26 LAB — URINALYSIS, COMPLETE
Bilirubin, UA: NEGATIVE
Glucose, UA: NEGATIVE
Ketones, UA: NEGATIVE
Nitrite, UA: NEGATIVE
Protein,UA: NEGATIVE
Specific Gravity, UA: 1.02 (ref 1.005–1.030)
Urobilinogen, Ur: 0.2 mg/dL (ref 0.2–1.0)
pH, UA: 6 (ref 5.0–7.5)

## 2024-02-26 LAB — MICROSCOPIC EXAMINATION
Epithelial Cells (non renal): >10 /HPF — AB (ref 0–10)
WBC, UA: >30 /HPF — AB (ref 0–5)

## 2024-02-26 MED ORDER — OXYBUTYNIN CHLORIDE 5 MG PO TABS
5.0000 mg | ORAL_TABLET | Freq: Three times a day (TID) | ORAL | 3 refills | Status: AC
Start: 2024-02-26 — End: ?

## 2024-02-26 MED ORDER — CIPROFLOXACIN HCL 250 MG PO TABS
250.0000 mg | ORAL_TABLET | Freq: Two times a day (BID) | ORAL | 0 refills | Status: AC
Start: 2024-02-26 — End: 2024-03-04

## 2024-02-26 NOTE — Telephone Encounter (Signed)
 Copied from CRM [id number]. 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 He states that his hands are weak and he is dropping things. Feels worried and doesn't know what he should do.   FYI Only or Action Required?: FYI only for provider  Patient was last seen in primary care on 12/22/2023 by Carollynn Cirri, NP. Called Nurse Triage reporting Tingling. Symptoms began about a month ago. Interventions attempted: Nothing. Symptoms are: gradually worsening.  Triage Disposition: See PCP When Office is Open (Within 3 Days)  Patient/caregiver understands and will follow disposition?: Yes   Reason for Disposition  [1] Numbness or tingling in one or both hands AND [2] is a chronic symptom (recurrent or ongoing AND present > 4 weeks)  Answer Assessment - Initial Assessment Questions 1. SYMPTOM: What is the main symptom you are concerned about? (e.g., weakness, numbness)     Tingling in fingers  2. ONSET: When did this start? (minutes, hours, days; while sleeping)     1 month 3. LAST NORMAL: When was the last time you (the patient) were normal (no symptoms)?     1 month ago  4. PATTERN Does this come and go, or has it been constant since it started?  Is it present now?     Constant  5. CARDIAC SYMPTOMS: Have you had any of the following symptoms: chest pain, difficulty breathing, palpitations?     No 6. NEUROLOGIC SYMPTOMS: Have you had any of the following symptoms: headache, dizziness, vision loss, double vision, changes in speech, unsteady on your feet?     No 7. OTHER SYMPTOMS: Do you have any other symptoms?     No  Protocols used: Neurologic Deficit-A-AH

## 2024-02-26 NOTE — Addendum Note (Signed)
 Addended byKatina Parlor on: 02/26/2024 10:57 AM   Modules accepted: Orders

## 2024-02-26 NOTE — Progress Notes (Signed)
 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  His enlarged prostate should be managed with a coud catheter and he uses them 7 times daily.   Patient continues to use the catheters as noted.  No infections on trimethoprim .  Takes 2 oxybutynin  at night and 1 in the morning.  Has to catheterize more frequently at night likely due to nocturnal diuresis and this was discussed again.  Clinically not infected   ll prescriptions renewed.  Call if renal ultrasound abnormal.  I will see in 1 year.  He has failed all the pills for erectile dysfunction.  I reviewed other options with him and he chose watchful waiting    TOday Incomplete bladder emptying stable.  Ultrasound from 1 year ago demonstrated right-sided hydronephrosis that resolved after voiding.  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 No infection on trimethoprim .  Continent on oxybutynin  2 tablets in the morning and 1 at night    All prescriptions renewed 3 months x 3 refills. Continue catheterizing 6 times a day. Reassess in 1 year. I will get an ultrasound next year has been stable   Today I last saw the patient June 2024.  He saw a nurse practitioner for cystitis symptoms January 2015.  He was given ciprofloxacin  but culture was negative.  He had a renal ultrasound June 2025 and he has increased echogenicity in keeping with medical renal disease.  He has mild right-sided hydronephrosis that resolved post bladder emptying.  He had multiple cysts right kidney with 1 measuring 7 cm.  Clinically not infected.  Takes 1 oxybutynin  in the morning 2 at night.  Takes daily  trimethoprim .  Incomplete bladder emptying stable.  Patient does not void spontaneously.   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 Continue current treatment regimen. Dietary sodium restriction. Regular aerobic exercise. Continue current medications.    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 Continue current treatment regimen.    CN (constipation) 01/22/2014   Last Assessment & Plan:  Start PEG powder every day.  incr water (hard w his situation fo self cathing).     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  Last Assessment & Plan:  Continue current exercise regimen to maximize lower extremity strength.  Discussed PT with patient, but patient reports being able to do exercises on his own as taught.      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  Last Assessment & Plan:  Refer to podiatry one time per year.      Surgical History: Past Surgical History:  Procedure Laterality Date   BACK SURGERY     REPLACEMENT  TOTAL KNEE Bilateral     Home Medications:  Allergies as of 02/26/2024       Reactions   No Known Allergies         Medication List        Accurate as of February 26, 2024  8:59 AM. If you have any questions, ask your nurse or doctor.          amLODipine  5 MG tablet Commonly known as: NORVASC  TAKE 1 TABLET EVERY DAY   lactulose 10 GM/15ML solution Commonly known as: CHRONULAC As needed   lisinopril  40 MG  tablet Commonly known as: ZESTRIL  TAKE 1 TABLET EVERY DAY   lovastatin  40 MG tablet Commonly known as: MEVACOR  TAKE 1 TABLET AT BEDTIME   mometasone  0.1 % lotion Commonly known as: ELOCON  Apply topically daily. Mix entire bottle into a 1 lbs jar of over the counter CeraVe cream and apply to itchy rash QD-BID PRN.   omeprazole  20 MG capsule Commonly known as: PRILOSEC TAKE 1 CAPSULE EVERY DAY   oxybutynin  5 MG tablet Commonly known as: DITROPAN  Take 1 tablet (5 mg total) by mouth 3 (three) times daily.   traZODone  50 MG tablet Commonly known as: DESYREL  TAKE 1 TABLET AT BEDTIME AS NEEDED FOR SLEEP   trimethoprim  100 MG tablet Commonly known as: TRIMPEX  Take 1 tablet (100 mg total) by mouth daily.        Allergies:  Allergies  Allergen Reactions   No Known Allergies     Family History: Family History  Problem Relation Age of Onset   Stroke Father    Heart disease Father     Social History:  reports that he has quit smoking. He has never used smokeless tobacco. He reports current alcohol use. He reports that he does not use drugs.  ROS:                                        Physical Exam: BP 130/63   Pulse 96   Ht 6' 1 (1.854 m)   Wt 93.4 kg   BMI 27.18 kg/m   Constitutional:  Alert and oriented, No acute distress. HEENT: [place] AT, moist mucus membranes.  Trachea midline, no masses.  Laboratory Data: Lab Results  Component Value Date   WBC 7.3 12/22/2023   HGB 10.1 (L) 12/22/2023   HCT 30.9 (L) 12/22/2023   MCV 98.4 12/22/2023   PLT 200 12/22/2023    Lab Results  Component Value Date   CREATININE 2.26 (H) 01/09/2024    Lab Results  Component Value Date   PSA 1.48 12/22/2023    No results found for: TESTOSTERONE  Lab Results  Component Value Date   HGBA1C 5.7 (H) 12/22/2023    Urinalysis    Component Value Date/Time   APPEARANCEUR Hazy (A) 10/09/2023 1357   GLUCOSEU Negative 10/09/2023 1357   BILIRUBINUR  Negative 10/09/2023 1357   PROTEINUR Negative 10/09/2023 1357   NITRITE Negative 10/09/2023 1357   LEUKOCYTESUR Trace (A) 10/09/2023 1357    Pertinent Imaging:   Assessment & Plan: Wants all prescriptions renewed for 3 months and 3 refills.  Wants a prescription of ciprofloxacin  for 1 week on hold just in case.  I will see in a year with renal ultrasound  1. Neurogenic bladder (Primary)  - Urinalysis, Complete   No follow-ups on file.  Devorah Fonder, MD  [facility]  [address] [place], [place] [address] [phone]

## 2024-02-26 NOTE — Telephone Encounter (Signed)
 Will discuss at upcoming appt.

## 2024-02-29 ENCOUNTER — Encounter: Payer: Self-pay | Admitting: Internal Medicine

## 2024-02-29 ENCOUNTER — Ambulatory Visit (INDEPENDENT_AMBULATORY_CARE_PROVIDER_SITE_OTHER): Admitting: Internal Medicine

## 2024-02-29 VITALS — BP 122/60 | Ht 73.0 in | Wt 205.4 lb

## 2024-02-29 DIAGNOSIS — E538 Deficiency of other specified B group vitamins: Secondary | ICD-10-CM | POA: Diagnosis not present

## 2024-02-29 DIAGNOSIS — R202 Paresthesia of skin: Secondary | ICD-10-CM

## 2024-02-29 DIAGNOSIS — R29898 Other symptoms and signs involving the musculoskeletal system: Secondary | ICD-10-CM | POA: Diagnosis not present

## 2024-02-29 DIAGNOSIS — E559 Vitamin D deficiency, unspecified: Secondary | ICD-10-CM

## 2024-02-29 NOTE — Patient Instructions (Signed)
Trigger Finger  Trigger finger, also called stenosing tenosynovitis, is a condition that causes a finger or thumb to get stuck in a bent position. Each finger has tough, cord-like tissue (tendon) that connects muscle to bone, and each tendon passes through a tunnel of tissue (tendon sheath). The tendon sheaths are held close to the bone by a pulley. There is a pulley called the A1 pulley that is involved in the triggering of a finger or thumb. To move your finger, your tendon needs to glide freely through the sheath. Trigger finger happens when the tendon or the sheath thickens, making it difficult to bend or straighten your finger as the thickened tendon gets stuck in the A1 pulley. Trigger finger can affect any of the fingers or the thumbs. Mild cases may clear up with rest and medicine. Severe cases require more treatment. What are the causes? Trigger finger or thumb is caused by a thickened finger tendon or tendon sheath. The cause of this thickening is not known. What increases the risk? The following factors may make you more likely to develop this condition: Doing the same movements many times (repetitive activity) that require a strong grip. Having certain health conditions. These include rheumatoid arthritis, gout, carpal tunnel syndrome, or diabetes. Being 40-60 years old. Being male. What are the signs or symptoms? Symptoms of this condition include: Pain when bending or straightening your finger. Tenderness, swelling, or a lump in the palm of your hand just below the finger joint. Hearing a noise like a pop or a snap when you try to straighten your finger. Feeling a catch or locked feeling when you try to straighten your finger. Being unable to straighten your finger without help from your other hand. How is this diagnosed? This condition is diagnosed based on your symptoms and a physical exam. How is this treated? This condition may be treated by: Resting your finger and  avoiding activities that make symptoms worse. Wearing a finger splint to keep your finger extended. Taking NSAIDs, such as ibuprofen, to relieve pain and swelling around the tendon. Doing gentle exercises to stretch the finger as told by your health care provider. Having medicine that reduces swelling and inflammation (steroids) injected into the tendon sheath. Injections may need to be repeated. Trigger finger release. This surgery is done to open the pulley. This may be done if other treatments do not work and you cannot straighten or bend your finger. You may need hand therapy after surgery. Follow these instructions at home: If you have a removable splint: Wear the splint as told by your provider. Remove it only as told by your provider. Check the skin around the splint every day. Tell your provider about any concerns. Loosen the splint if your fingers tingle, become numb, or turn cold and blue. Keep the splint clean and dry. If the splint is not waterproof: Do not let it get wet. Cover it with a watertight covering when you take a bath or shower. Managing pain, stiffness, and swelling     If told, put ice on the painful area. If you have a removable splint, remove it as told by your health care provider. Put ice in a plastic bag. Place a towel between your skin and the bag or between your splint and the bag. Leave the ice on for 20 minutes, 2-3 times a day. If told, apply heat to the affected area as often as told by your provider. Use the heat source that your provider recommends, such as   a moist heat pack or a heating pad. Place a towel between your skin and the heat source. Leave the heat on for 20-30 minutes. If your skin turns bright red, remove the ice or heat right away to prevent skin damage. The risk of damage is higher if you cannot feel pain, heat, or cold. Activity Rest your finger as told by your provider. Avoid activities that make the pain worse. Return to your  normal activities as told by your provider. Ask your provider what activities are safe for you. Do exercises as told by your provider. Ask your provider when it is safe to drive if you have a splint on your hand. General instructions Take over-the-counter and prescription medicines only as told by your provider. Keep all follow-up visits. These are needed to see how you are progressing. Contact a health care provider if: Your symptoms are not improving with home care. This information is not intended to replace advice given to you by your health care provider. Make sure you discuss any questions you have with your health care provider. Document Revised: 04/15/2022 Document Reviewed: 04/15/2022 Elsevier Patient Education  2024 [employer]  

## 2024-02-29 NOTE — Progress Notes (Signed)
 Subjective:    Patient ID: Hector Simmons, male    DOB: 09/04/1941, 83 y.o.   MRN: [id number]  HPI   Discussed the use of AI scribe software for clinical note transcription with the patient, who gave verbal consent to proceed.  Hector Simmons is an 83 year old male with peripheral nerve disease who presents with numbness and tingling in his fingers.  He has been experiencing numbness and tingling in his fingers for the past three weeks, which appeared suddenly and affects both hands equally. He has difficulty with fine motor tasks such as picking things up and signing his name. There is no burning sensation, pain, or joint swelling, but he notes decreased grip strength and occasional 'trigger finger' where his fingers do not straighten out.  He has a history of peripheral nerve disease and is prediabetic, though he is not currently on treatment for neuropathy. The numbness and tingling are consistent throughout the day without significant changes, though he feels they may be slightly worsening. No pain radiating down his arms.  He has a history of Dupuytren's contracture, diagnosed by a hand specialist six or seven years ago, which improved without intervention. 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  He is not taking any medications specifically for his hand symptoms.       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 Continue current treatment regimen. Dietary sodium restriction. Regular aerobic exercise. Continue current medications.    Chronic kidney disease (CKD), stage III (moderate) (HCC) 10/23/2013   Last  Assessment & Plan:  Renal condition is stable. Continue current treatment regimen.    CN (constipation) 01/22/2014   Last Assessment & Plan:  Start PEG powder every day.  incr water (hard w his situation fo self cathing).     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  Last Assessment & Plan:  Continue current exercise regimen to maximize lower extremity strength.  Discussed PT with patient, but patient reports being able to do exercises on his own as taught.      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  Last Assessment & Plan:  Refer to podiatry one time per year.      Current Outpatient Medications  Medication Sig Dispense Refill   amLODipine  (NORVASC ) 5 MG tablet TAKE 1 TABLET EVERY DAY 90 tablet 1   ciprofloxacin  (CIPRO ) 250 MG tablet Take 1 tablet (250 mg total) by mouth 2 (two) times daily for 7 days. 14 tablet 0   lactulose (CHRONULAC) 10 GM/15ML solution As needed     lisinopril  (ZESTRIL ) 40 MG tablet TAKE 1 TABLET EVERY DAY 90 tablet 1   lovastatin  (MEVACOR ) 40 MG tablet TAKE 1 TABLET AT BEDTIME 90 tablet 1   mometasone  (ELOCON ) 0.1 % lotion Apply topically daily. Mix entire bottle into a 1 lbs jar of over the counter CeraVe cream and apply to itchy rash QD-BID PRN. 60 mL 0  omeprazole  (PRILOSEC) 20 MG capsule TAKE 1 CAPSULE EVERY DAY 90 capsule 1   oxybutynin  (DITROPAN ) 5 MG tablet Take 1 tablet (5 mg total) by mouth 3 (three) times daily. 270 tablet 3   traZODone  (DESYREL ) 50 MG tablet TAKE 1 TABLET AT BEDTIME AS NEEDED FOR SLEEP 90 tablet 1   trimethoprim  (TRIMPEX ) 100 MG tablet Take 1 tablet (100 mg total) by mouth daily. 90 tablet 3   No current facility-administered medications for this visit.    Allergies  Allergen Reactions   No Known Allergies      Family History  Problem Relation Age of Onset   Stroke Father    Heart disease Father     Social History   Socioeconomic History   Marital status: Divorced    Spouse name: Not on file   Number of children: Not on file   Years of education: Not on file   Highest education level: Not on file  Occupational History   Not on file  Tobacco Use   Smoking status: Former   Smokeless tobacco: Never  Substance and Sexual Activity   Alcohol use: Yes    Alcohol/week: 0.0 standard drinks of alcohol   Drug use: No   Sexual activity: Not on file  Other Topics Concern   Not on file  Social History Narrative   Not on file   Social Drivers of Health   Financial Resource Strain: Low Risk  (12/22/2023)   Overall Financial Resource Strain (CARDIA)    Difficulty of Paying Living Expenses: Not hard at all  Food Insecurity: No Food Insecurity (12/22/2023)   Hunger Vital Sign    Worried About Running Out of Food in the Last Year: Never true    Ran Out of Food in the Last Year: Never true  Transportation Needs: No Transportation Needs (12/22/2023)   PRAPARE - Administrator, Civil Service (Medical): No    Lack of Transportation (Non-Medical): No  Physical Activity: Sufficiently Active (12/22/2023)   Exercise Vital Sign    Days of Exercise per Week: 4 days    Minutes of Exercise per Session: 60 min  Stress: No Stress Concern Present (12/22/2023)   [employer] of Occupational Health - Occupational Stress Questionnaire    Feeling of Stress : Only a little  Social Connections: Moderately Isolated (12/22/2023)   Social Connection and Isolation Panel    Frequency of Communication with Friends and Family: More than three times a week    Frequency of Social Gatherings with Friends and Family: Twice a week    Attends Religious Services: More than 4 times per year    Active Member of [employer] or Organizations: No    Attends Banker Meetings: Never    Marital Status:  Divorced  Catering manager Violence: Not At Risk (12/22/2023)   Humiliation, Afraid, Rape, and Kick questionnaire    Fear of Current or Ex-Partner: No    Emotionally Abused: No    Physically Abused: No    Sexually Abused: No     Constitutional: Denies fever, malaise, fatigue, headache or abrupt weight changes.  Respiratory: Denies difficulty breathing, shortness of breath, cough or sputum production.   Cardiovascular: Denies chest pain, chest tightness, palpitations or swelling in the hands or feet.  Musculoskeletal: Patient reports difficulty with gait, bilateral hand weakness.  Denies decrease in range of motion, muscle pain or joint pain and swelling.  Skin: Denies redness, rashes, lesions or ulcercations.  Neurological: Patient reports paresthesia of fingertips,  neuropathic pain, difficulty with balance.  Denies dizziness, difficulty with memory, difficulty with speech or problems coordination.    No other specific complaints in a complete review of systems (except as listed in HPI above).      Objective:   Physical Exam  BP 122/60 (BP Location: Left Arm, Patient Position: Sitting, Cuff Size: Normal)   Ht 6' 1 (1.854 m)   Wt 205 lb 6.4 oz (93.2 kg)   BMI 27.10 kg/m    Wt Readings from Last 3 Encounters:  02/26/24 206 lb (93.4 kg)  12/22/23 203 lb (92.1 kg)  12/22/23 204 lb 9.6 oz (92.8 kg)    General: Appears his stated age, overweight, in NAD. Cardiovascular: Normal rate and rhythm.  Radial pulses 2+ bilaterally.  Cap refill less than 3 seconds bilaterally. Pulmonary/Chest: Normal effort and positive vesicular breath sounds. No respiratory distress. No wheezes, rales or ronchi noted.  Musculoskeletal: Normal flexion of the fingers but decreased extension of the right index finger and left ring finger secondary to Dupuytren's contracture.  Handgrips diminished but equal. Neurological: Alert and oriented.  Normal sensation of fingers with monofilament testing.  Negative  Tinel's.  Negative Phalen's.  Coordination normal.   BMET    Component Value Date/Time   NA 137 01/09/2024 0845   K 5.6 (H) 01/09/2024 0845   CL 107 01/09/2024 0845   CO2 24 01/09/2024 0845   GLUCOSE 102 01/09/2024 0845   BUN 35 (H) 01/09/2024 0845   CREATININE 2.26 (H) 01/09/2024 0845   CALCIUM 9.1 01/09/2024 0845    Lipid Panel     Component Value Date/Time   CHOL 141 12/22/2023 0906   TRIG 75 12/22/2023 0906   HDL 60 12/22/2023 0906   CHOLHDL 2.4 12/22/2023 0906   LDLCALC 65 12/22/2023 0906    CBC    Component Value Date/Time   WBC 7.3 12/22/2023 0906   RBC 3.14 (L) 12/22/2023 0906   HGB 10.1 (L) 12/22/2023 0906   HCT 30.9 (L) 12/22/2023 0906   PLT 200 12/22/2023 0906   MCV 98.4 12/22/2023 0906   MCH 32.2 12/22/2023 0906   MCHC 32.7 12/22/2023 0906   RDW 14.5 12/22/2023 0906    Hgb A1C Lab Results  Component Value Date   HGBA1C 5.7 (H) 12/22/2023            Assessment & Plan:   Assessment and Plan    Hand weakness and numbness Bilateral hand weakness and numbness for three weeks. Differential includes neuropathy, arthritis, and Dupuytren's contracture. History of Dupuytren's contracture and arthritis may contribute. Neuropathy may relate to vitamin deficiencies or thyroid issues. - Order labs for vitamin D, vitamin B12, folate, and thyroid function. - Advise hand exercises using a stress ball or rice-filled balloon. - Discuss potential referral to a hand surgeon for evaluation and possible steroid injection if symptoms persist.  Arthritis Arthritis in hands may contribute to weakness and grip difficulty, likely causing item dropping. - Advise hand exercises using a stress ball or rice-filled balloon.  Dupuytren's contracture Dupuytren's contracture could contribute to hand weakness and grip difficulty. Previously diagnosed, no treatment administered. Steroid injections could be considered if symptoms persist. - Consider referral to a hand surgeon  for evaluation and possible steroid injection if symptoms persist.    RTC in 4months, follow-up chronic conditions Helayne Lo, NP

## 2024-03-01 ENCOUNTER — Ambulatory Visit: Payer: Self-pay | Admitting: Internal Medicine

## 2024-03-01 LAB — VITAMIN B12: Vitamin B-12: 253 pg/mL (ref 200–1100)

## 2024-03-01 LAB — TSH: TSH: 1.43 m[IU]/L (ref 0.40–4.50)

## 2024-03-01 LAB — VITAMIN D 25 HYDROXY (VIT D DEFICIENCY, FRACTURES): Vit D, 25-Hydroxy: 30 ng/mL (ref 30–100)

## 2024-03-01 LAB — FOLATE: Folate: 8.2 ng/mL

## 2024-03-21 ENCOUNTER — Telehealth: Payer: Self-pay

## 2024-03-21 DIAGNOSIS — K592 Neurogenic bowel, not elsewhere classified: Secondary | ICD-10-CM

## 2024-03-21 NOTE — Telephone Encounter (Signed)
 Has not had a BM in about 2 weeks, has taken miralax. Has lactolose will to take that. He would like a referral to GI.

## 2024-03-21 NOTE — Telephone Encounter (Signed)
 Copied from CRM [id number]. Topic: Clinical - Medical Advice >> Mar 21, 2024 12:36 PM Emylou G wrote: Reason for CRM: Please call patient back.. has concerns w/his constipation.

## 2024-03-22 NOTE — Telephone Encounter (Signed)
 Referral placed.

## 2024-03-22 NOTE — Telephone Encounter (Signed)
 He already sees Dr. Aundria at [facility] for his constipation, has been seen this year. He just needs to call and schedule an appt. He can also try a fleets enema x 2 if lactulose is ineffective.

## 2024-03-22 NOTE — Addendum Note (Signed)
 Addended by: ANTONETTE ANGELINE ORN on: 03/22/2024 02:04 PM   Modules accepted: Orders

## 2024-03-22 NOTE — Telephone Encounter (Signed)
 Spoke with patient he is adamant in not seeing Dr. Aundria again. Stated all Dr. Aundria suggests is to take lactulose, Hector Simmons feels like that is too powerful for him.   He would like to be referred to someone else in the area, doesn't know his way around [place].

## 2024-03-22 NOTE — Telephone Encounter (Signed)
 I don't think this is a good idea given his issues with constipation. It will take probably 4-6 months to get him in with another GI doctor and that's if anyone in this area will see him since he is already established with a GI doctor in this area. I may be able to get him somewhere in [place]. Please let me know if he would still like referral.

## 2024-04-08 ENCOUNTER — Ambulatory Visit: Payer: Self-pay

## 2024-04-08 NOTE — Telephone Encounter (Signed)
 SABRA

## 2024-04-08 NOTE — Telephone Encounter (Signed)
 FYI Only or Action Required?: FYI only for provider.  Patient was last seen in primary care on 02/29/2024 by Antonette Angeline ORN, NP.  Called Nurse Triage reporting Leg Injury and leg wound.  Symptoms began several days ago.  Interventions attempted: Other: cleaned and dressed at home.  Symptoms are: gradually worsening.  Triage Disposition: See PCP When Office is Open (Within 3 Days)  Patient/caregiver understands and will follow disposition?: Yes  pt messed up leg with metal he wants to speak with a nyrse please follow up   Reason for Disposition  [1] Last tetanus shot > 5 years ago AND [2] DIRTY scrape  Answer Assessment - Initial Assessment Questions 1. APPEARANCE of INJURY: What does the injury look like?      Back of leg, difficult to see 2. SIZE: How large is the scrape?      6-8 inches long  3. BLEEDING: Is it bleeding now? If Yes, ask: Is it difficult to stop?      Bleeding has ceased 4. LOCATION: Where is the injury located?      Left leg (calf) 5. ONSET: How long ago did the injury occur?      Five days ago 6. MECHANISM: Tell me how it happened.      Fell onto a chair outside. Injured by a metal piece on the chair 7. TETANUS: When was your last tetanus booster?     Unknown  Wound had been dressed a couple of times by his son.  Patient states that he has had some swelling, but denies fever  Reassured that it was ok to remove dressing, wash, and replace dressing, but patient states that he is unable to do so on his own  Protocols used: Scrapes-A-AH

## 2024-04-08 NOTE — Telephone Encounter (Signed)
Will discuss at upcoming appt tomorrow °

## 2024-04-09 ENCOUNTER — Encounter: Payer: Self-pay | Admitting: Internal Medicine

## 2024-04-09 ENCOUNTER — Ambulatory Visit (INDEPENDENT_AMBULATORY_CARE_PROVIDER_SITE_OTHER): Admitting: Internal Medicine

## 2024-04-09 VITALS — BP 122/64 | Ht 73.0 in | Wt 206.0 lb

## 2024-04-09 DIAGNOSIS — W010XXA Fall on same level from slipping, tripping and stumbling without subsequent striking against object, initial encounter: Secondary | ICD-10-CM | POA: Diagnosis not present

## 2024-04-09 DIAGNOSIS — S81802A Unspecified open wound, left lower leg, initial encounter: Secondary | ICD-10-CM

## 2024-04-09 MED ORDER — CEPHALEXIN 500 MG PO CAPS: 500.0000 mg | ORAL_CAPSULE | Freq: Three times a day (TID) | ORAL | 0 refills | Status: DC

## 2024-04-09 NOTE — Patient Instructions (Signed)

## 2024-04-09 NOTE — Progress Notes (Signed)
 Subjective:    Patient ID: Hector Simmons, male    DOB: 10/28/1940, 83 y.o.   MRN: [id number]  HPI   Discussed the use of AI scribe software for clinical note transcription with the patient, who gave verbal consent to proceed.  Hector Simmons is an 83 year old male who presents with a large skin tear on his left  leg sustained from a fall.  Approximately a week ago, he sustained a skin tear on his leg while at his girlfriend's house. The injury occurred when he was [place] from the beach, dropped something on a metal frame chair, and lost his balance.  He has been cleaning the wound with hydrogen peroxide and dressing it twice since the incident. He is not on any blood thinners and has not taken any aspirin or other medications for the injury. His last tetanus shot was in 2022.  His son helps him out but only visits once a week. He describes the wound as not healing and notes that there is a significant amount of skin missing.  No known allergies to medications.      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 Continue current treatment regimen. Dietary sodium restriction. Regular aerobic exercise. Continue current medications.    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 Continue current treatment regimen.    CN (constipation) 01/22/2014   Last Assessment & Plan:  Start PEG powder every day.  incr water (hard w his situation fo self cathing).     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  Last Assessment & Plan:  Continue current exercise regimen to maximize lower extremity strength.  Discussed PT with patient, but patient reports being able to do exercises on his own as taught.      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  Last Assessment & Plan:  Refer to podiatry one time per year.      Current Outpatient Medications  Medication Sig Dispense Refill   amLODipine  (NORVASC ) 5 MG tablet TAKE 1 TABLET EVERY DAY 90 tablet 1   lovastatin  (MEVACOR ) 40 MG tablet TAKE 1 TABLET AT BEDTIME 90 tablet 1   mometasone  (ELOCON ) 0.1 % lotion Apply topically daily. Mix entire bottle into a 1 lbs jar of over the counter CeraVe cream and apply to itchy rash QD-BID PRN. 60 mL 0   omeprazole  (PRILOSEC) 20 MG capsule TAKE 1 CAPSULE EVERY DAY 90 capsule 1   oxybutynin  (DITROPAN ) 5 MG tablet Take 1 tablet (5 mg total) by mouth 3 (three) times daily. 270 tablet 3   polyethylene glycol (MIRALAX / GLYCOLAX) 17 g packet Take 17 g by mouth daily as needed for moderate constipation.     traZODone  (DESYREL ) 50 MG tablet TAKE 1 TABLET AT BEDTIME AS NEEDED FOR SLEEP 90 tablet 1   trimethoprim  (TRIMPEX ) 100 MG tablet Take 1 tablet (100 mg total) by mouth daily. 90 tablet  3   No current facility-administered medications for this visit.    Allergies  Allergen Reactions   No Known Allergies     Family History  Problem Relation Age of Onset   Stroke Father    Heart disease Father     Social History   Socioeconomic History   Marital status: Divorced    Spouse name: Not on file   Number of children: Not on file   Years of education: Not on file   Highest education level: Not on file  Occupational History   Not on file  Tobacco Use   Smoking status: Former   Smokeless tobacco: Never  Substance and Sexual Activity   Alcohol use: Yes    Alcohol/week: 0.0 standard drinks of alcohol   Drug use: No    Sexual activity: Not on file  Other Topics Concern   Not on file  Social History Narrative   Not on file   Social Drivers of Health   Financial Resource Strain: Low Risk  (12/22/2023)   Overall Financial Resource Strain (CARDIA)    Difficulty of Paying Living Expenses: Not hard at all  Food Insecurity: No Food Insecurity (12/22/2023)   Hunger Vital Sign    Worried About Running Out of Food in the Last Year: Never true    Ran Out of Food in the Last Year: Never true  Transportation Needs: No Transportation Needs (12/22/2023)   PRAPARE - Administrator, Civil Service (Medical): No    Lack of Transportation (Non-Medical): No  Physical Activity: Sufficiently Active (12/22/2023)   Exercise Vital Sign    Days of Exercise per Week: 4 days    Minutes of Exercise per Session: 60 min  Stress: No Stress Concern Present (12/22/2023)   [employer] of Occupational Health - Occupational Stress Questionnaire    Feeling of Stress : Only a little  Social Connections: Moderately Isolated (12/22/2023)   Social Connection and Isolation Panel    Frequency of Communication with Friends and Family: More than three times a week    Frequency of Social Gatherings with Friends and Family: Twice a week    Attends Religious Services: More than 4 times per year    Active Member of [employer] or Organizations: No    Attends Banker Meetings: Never    Marital Status: Divorced  Catering manager Violence: Not At Risk (12/22/2023)   Humiliation, Afraid, Rape, and Kick questionnaire    Fear of Current or Ex-Partner: No    Emotionally Abused: No    Physically Abused: No    Sexually Abused: No     Constitutional: Denies fever, malaise, fatigue, headache or abrupt weight changes.  Respiratory: Denies difficulty breathing, shortness of breath, cough or sputum production.   Cardiovascular: Denies chest pain, chest tightness, palpitations or swelling in the hands or feet.  Musculoskeletal:  Patient reports difficulty with gait.  Denies decrease in range of motion, muscle pain or joint pain and swelling.  Skin: Patient reports skin tear to left leg.  Denies redness, rashes, lesions.  Neurological: Patient reports paresthesia of fingertips, neuropathic pain, difficulty with balance.  Denies dizziness, difficulty with memory, difficulty with speech or problems coordination.    No other specific complaints in a complete review of systems (except as listed in HPI above).      Objective:   Physical Exam  BP 122/64 (BP Location: Left Arm, Patient Position: Sitting, Cuff Size: Normal)   Ht 6' 1 (1.854 m)   Wt  206 lb (93.4 kg)   BMI 27.18 kg/m    Wt Readings from Last 3 Encounters:  02/29/24 205 lb 6.4 oz (93.2 kg)  02/26/24 206 lb (93.4 kg)  12/22/23 203 lb (92.1 kg)    General: Appears his stated age, overweight, in NAD. Skin: 21 cm x 3 cm skin tear noted to the left lateral leg starting at the knee extending down to the mid calf.  Most of the skin is missing.  Slight redness around the wound but no evidence of infection at this time.  Bleeding profusely.  Cardiovascular: Normal rate and rhythm.   Pulmonary/Chest: Normal effort and positive vesicular breath sounds. No respiratory distress. No wheezes, rales or ronchi noted.  Musculoskeletal: Limping gait with assistance from a cane. Neurological: Alert and oriented.    BMET    Component Value Date/Time   NA 137 01/09/2024 0845   K 5.6 (H) 01/09/2024 0845   CL 107 01/09/2024 0845   CO2 24 01/09/2024 0845   GLUCOSE 102 01/09/2024 0845   BUN 35 (H) 01/09/2024 0845   CREATININE 2.26 (H) 01/09/2024 0845   CALCIUM 9.1 01/09/2024 0845    Lipid Panel     Component Value Date/Time   CHOL 141 12/22/2023 0906   TRIG 75 12/22/2023 0906   HDL 60 12/22/2023 0906   CHOLHDL 2.4 12/22/2023 0906   LDLCALC 65 12/22/2023 0906    CBC    Component Value Date/Time   WBC 7.3 12/22/2023 0906   RBC 3.14 (L) 12/22/2023 0906    HGB 10.1 (L) 12/22/2023 0906   HCT 30.9 (L) 12/22/2023 0906   PLT 200 12/22/2023 0906   MCV 98.4 12/22/2023 0906   MCH 32.2 12/22/2023 0906   MCHC 32.7 12/22/2023 0906   RDW 14.5 12/22/2023 0906    Hgb A1C Lab Results  Component Value Date   HGBA1C 5.7 (H) 12/22/2023            Assessment & Plan:   Assessment and Plan    Open wound of left lower leg Open wound with significant skin loss, no healing signs. Tetanus vaccination current. Requires daily dressing changes, lacks family assistance. - Clean and redress wound with saline and appropriate dressings. - Prescribe Keflex  500 mg orally TID for 10 days. - Arrange daily home health nurse visits for wound care. - Instruct to monitor for infection signs and contact provider if issues arise.         RTC in 3 months, follow-up chronic conditions Angeline Laura, NP

## 2024-05-06 DIAGNOSIS — R339 Retention of urine, unspecified: Secondary | ICD-10-CM | POA: Diagnosis not present

## 2024-05-14 ENCOUNTER — Telehealth: Payer: Self-pay

## 2024-05-14 NOTE — Telephone Encounter (Signed)
 Copied from CRM [id number]. Topic: Clinical - Home Health Verbal Orders >> May 14, 2024  9:17 AM Mia F wrote: Caller/AgencyBETHA Seal [facility] Callback Number: [phone] Service Requested: Wound Care Serviced Frequency: NA Any new concerns about the patient? Yes- Pt had an open wound that has health and pt wants to discontinue services. A nurse will come out one more time to discharge pt

## 2024-05-14 NOTE — Telephone Encounter (Signed)
 Ana notified to discontinue services.

## 2024-05-14 NOTE — Telephone Encounter (Signed)
 Noted, okay to discontinue services

## 2024-06-01 ENCOUNTER — Other Ambulatory Visit: Payer: Self-pay

## 2024-06-01 ENCOUNTER — Emergency Department

## 2024-06-01 ENCOUNTER — Emergency Department: Admission: EM | Admit: 2024-06-01 | Discharge: 2024-06-02 | Discharge status: Against Medical Advice

## 2024-06-01 DIAGNOSIS — M6282 Rhabdomyolysis: Secondary | ICD-10-CM | POA: Insufficient documentation

## 2024-06-01 DIAGNOSIS — N189 Chronic kidney disease, unspecified: Secondary | ICD-10-CM | POA: Diagnosis not present

## 2024-06-01 DIAGNOSIS — Z9181 History of falling: Secondary | ICD-10-CM | POA: Insufficient documentation

## 2024-06-01 DIAGNOSIS — R9082 White matter disease, unspecified: Secondary | ICD-10-CM | POA: Diagnosis not present

## 2024-06-01 DIAGNOSIS — Z043 Encounter for examination and observation following other accident: Secondary | ICD-10-CM | POA: Diagnosis present

## 2024-06-01 DIAGNOSIS — M25532 Pain in left wrist: Secondary | ICD-10-CM | POA: Diagnosis not present

## 2024-06-01 DIAGNOSIS — Z5329 Procedure and treatment not carried out because of patient's decision for other reasons: Secondary | ICD-10-CM | POA: Insufficient documentation

## 2024-06-01 DIAGNOSIS — W182XXA Fall in (into) shower or empty bathtub, initial encounter: Secondary | ICD-10-CM | POA: Insufficient documentation

## 2024-06-01 DIAGNOSIS — R911 Solitary pulmonary nodule: Secondary | ICD-10-CM | POA: Insufficient documentation

## 2024-06-01 DIAGNOSIS — R918 Other nonspecific abnormal finding of lung field: Secondary | ICD-10-CM | POA: Diagnosis not present

## 2024-06-01 DIAGNOSIS — S3993XA Unspecified injury of pelvis, initial encounter: Secondary | ICD-10-CM | POA: Diagnosis not present

## 2024-06-01 DIAGNOSIS — T07XXXA Unspecified multiple injuries, initial encounter: Secondary | ICD-10-CM | POA: Diagnosis not present

## 2024-06-01 DIAGNOSIS — I700 Atherosclerosis of aorta: Secondary | ICD-10-CM | POA: Diagnosis not present

## 2024-06-01 DIAGNOSIS — E785 Hyperlipidemia, unspecified: Secondary | ICD-10-CM | POA: Insufficient documentation

## 2024-06-01 DIAGNOSIS — I517 Cardiomegaly: Secondary | ICD-10-CM | POA: Diagnosis not present

## 2024-06-01 DIAGNOSIS — D631 Anemia in chronic kidney disease: Secondary | ICD-10-CM | POA: Diagnosis not present

## 2024-06-01 DIAGNOSIS — R7401 Elevation of levels of liver transaminase levels: Secondary | ICD-10-CM | POA: Insufficient documentation

## 2024-06-01 DIAGNOSIS — M1612 Unilateral primary osteoarthritis, left hip: Secondary | ICD-10-CM | POA: Diagnosis not present

## 2024-06-01 DIAGNOSIS — I4510 Unspecified right bundle-branch block: Secondary | ICD-10-CM | POA: Diagnosis not present

## 2024-06-01 DIAGNOSIS — R7989 Other specified abnormal findings of blood chemistry: Secondary | ICD-10-CM | POA: Diagnosis not present

## 2024-06-01 DIAGNOSIS — N319 Neuromuscular dysfunction of bladder, unspecified: Secondary | ICD-10-CM | POA: Diagnosis not present

## 2024-06-01 DIAGNOSIS — I4891 Unspecified atrial fibrillation: Secondary | ICD-10-CM | POA: Diagnosis not present

## 2024-06-01 DIAGNOSIS — S299XXA Unspecified injury of thorax, initial encounter: Secondary | ICD-10-CM | POA: Diagnosis not present

## 2024-06-01 DIAGNOSIS — E041 Nontoxic single thyroid nodule: Secondary | ICD-10-CM | POA: Diagnosis not present

## 2024-06-01 DIAGNOSIS — S0990XA Unspecified injury of head, initial encounter: Secondary | ICD-10-CM | POA: Diagnosis not present

## 2024-06-01 DIAGNOSIS — R000 Tachycardia, unspecified: Secondary | ICD-10-CM | POA: Diagnosis present

## 2024-06-01 DIAGNOSIS — Z87440 Personal history of urinary (tract) infections: Secondary | ICD-10-CM | POA: Diagnosis not present

## 2024-06-01 DIAGNOSIS — M25539 Pain in unspecified wrist: Secondary | ICD-10-CM | POA: Diagnosis not present

## 2024-06-01 DIAGNOSIS — W19XXXA Unspecified fall, initial encounter: Secondary | ICD-10-CM | POA: Diagnosis not present

## 2024-06-01 LAB — CBC WITH DIFFERENTIAL/PLATELET
Abs Immature Granulocytes: 0.06 K/uL (ref 0.00–0.07)
Basophils Absolute: 0 K/uL (ref 0.0–0.1)
Basophils Relative: 0 %
Eosinophils Absolute: 0 K/uL (ref 0.0–0.5)
Eosinophils Relative: 0 %
HCT: 34.5 % — ABNORMAL LOW (ref 39.0–52.0)
Hemoglobin: 11.6 g/dL — ABNORMAL LOW (ref 13.0–17.0)
Immature Granulocytes: 1 %
Lymphocytes Relative: 3 %
Lymphs Abs: 0.3 K/uL — ABNORMAL LOW (ref 0.7–4.0)
MCH: 31.4 pg (ref 26.0–34.0)
MCHC: 33.6 g/dL (ref 30.0–36.0)
MCV: 93.5 fL (ref 80.0–100.0)
Monocytes Absolute: 1 K/uL (ref 0.1–1.0)
Monocytes Relative: 10 %
Neutro Abs: 8.4 K/uL — ABNORMAL HIGH (ref 1.7–7.7)
Neutrophils Relative %: 86 %
Platelets: 157 K/uL (ref 150–400)
RBC: 3.69 MIL/uL — ABNORMAL LOW (ref 4.22–5.81)
RDW: 14.2 % (ref 11.5–15.5)
WBC: 9.7 K/uL (ref 4.0–10.5)
nRBC: 0 % (ref 0.0–0.2)

## 2024-06-01 LAB — PROTIME-INR
INR: 1.1 (ref 0.8–1.2)
Prothrombin Time: 14.7 s (ref 11.4–15.2)

## 2024-06-01 LAB — COMPREHENSIVE METABOLIC PANEL WITH GFR
ALT: 66 U/L — ABNORMAL HIGH (ref 0–44)
AST: 176 U/L — ABNORMAL HIGH (ref 15–41)
Albumin: 3.7 g/dL (ref 3.5–5.0)
Alkaline Phosphatase: 69 U/L (ref 38–126)
Anion gap: 12 (ref 5–15)
BUN: 42 mg/dL — ABNORMAL HIGH (ref 8–23)
CO2: 19 mmol/L — ABNORMAL LOW (ref 22–32)
Calcium: 8.7 mg/dL — ABNORMAL LOW (ref 8.9–10.3)
Chloride: 100 mmol/L (ref 98–111)
Creatinine, Ser: 2.35 mg/dL — ABNORMAL HIGH (ref 0.61–1.24)
GFR, Estimated: 27 mL/min — ABNORMAL LOW
Glucose, Bld: 203 mg/dL — ABNORMAL HIGH (ref 70–99)
Potassium: 4.3 mmol/L (ref 3.5–5.1)
Sodium: 131 mmol/L — ABNORMAL LOW (ref 135–145)
Total Bilirubin: 1.4 mg/dL — ABNORMAL HIGH (ref 0.0–1.2)
Total Protein: 7 g/dL (ref 6.5–8.1)

## 2024-06-01 LAB — D-DIMER, QUANTITATIVE: D-Dimer, Quant: 4.71 ug{FEU}/mL — ABNORMAL HIGH (ref 0.00–0.50)

## 2024-06-01 LAB — CK: Total CK: 3999 U/L — ABNORMAL HIGH (ref 49–397)

## 2024-06-01 LAB — TROPONIN I (HIGH SENSITIVITY): Troponin I (High Sensitivity): 97 ng/L — ABNORMAL HIGH (ref <18)

## 2024-06-01 LAB — LIPASE, BLOOD: Lipase: 28 U/L (ref 11–51)

## 2024-06-01 MED ORDER — SODIUM CHLORIDE 0.9 % IV BOLUS
500.0000 mL | Freq: Once | INTRAVENOUS | Status: AC
  Administered 2024-06-01: 500 mL via INTRAVENOUS

## 2024-06-01 MED ORDER — METOPROLOL TARTRATE 25 MG PO TABS
25.0000 mg | ORAL_TABLET | Freq: Once | ORAL | Status: AC
  Administered 2024-06-01: 25 mg via ORAL
  Filled 2024-06-01: qty 1

## 2024-06-01 MED ORDER — METOPROLOL TARTRATE 5 MG/5ML IV SOLN
5.0000 mg | Freq: Once | INTRAVENOUS | Status: AC
  Administered 2024-06-01: 5 mg via INTRAVENOUS
  Filled 2024-06-01: qty 5

## 2024-06-01 MED ORDER — SODIUM CHLORIDE 0.9 % IV BOLUS
1000.0000 mL | Freq: Once | INTRAVENOUS | Status: AC
  Administered 2024-06-01: 1000 mL via INTRAVENOUS

## 2024-06-01 MED ORDER — METOPROLOL TARTRATE 25 MG PO TABS: 25.0000 mg | ORAL_TABLET | Freq: Two times a day (BID) | ORAL | 0 refills | Status: AC

## 2024-06-01 NOTE — ED Triage Notes (Signed)
 Pt from home BIB [facility] for slip and fall last night @ 2300, he was found by his neighbor @ 1900 today.

## 2024-06-01 NOTE — ED Triage Notes (Signed)
 No LOC or head strike, - thinners

## 2024-06-01 NOTE — ED Provider Notes (Signed)
 [facility] Provider Note    Event Date/Time   First MD Initiated Contact with Patient 06/01/24 1947     (approximate)   History   Fall  Pt from home BIB [facility] for slip and fall last night @ 2300, he was found by his neighbor @ 1900 today.   No LOC or head strike, - thinners   HPI Hector Simmons is a 83 y.o. male PMH CKD, anemia of chronic disease, recurrent UTI, neurogenic bladder, hyperlipidemia presents for evaluation after fall - Patient states he was in the shower yesterday evening, slipped and fell backward onto a seat in the shower and then shortly thereafter fell onto the ground.  Unsure if he hit his head.  No LOC.  No vomiting.  Was unable to get up and had to stay in his shower until a neighbor found him at 7 PM today. -Says he has otherwise been in his usual state of health. -Chronically straight caths, does note his urine has been cloudy recently -No fever, cough, dizziness, chest pain, abdominal pain, nausea/vomiting/diarrhea     Physical Exam   Triage Vital Signs: ED Triage Vitals  Encounter Vitals Group     BP 06/01/24 1949 118/86     Girls Systolic BP Percentile --      Girls Diastolic BP Percentile --      Boys Systolic BP Percentile --      Boys Diastolic BP Percentile --      Pulse Rate 06/01/24 1949 (!) 49     Resp 06/01/24 1949 18     Temp 06/01/24 1949 98.4 F (36.9 C)     Temp Source 06/01/24 1949 Oral     SpO2 06/01/24 1949 100 %     Weight 06/01/24 1950 200 lb (90.7 kg)     Height 06/01/24 1950 6' 1 (1.854 m)     Head Circumference --      Peak Flow --      Pain Score 06/01/24 1950 0     Pain Loc --      Pain Education --      Exclude from Growth Chart --     Most recent vital signs: Vitals:   06/01/24 2245 06/01/24 2300  BP:  112/87  Pulse: (!) 112 (!) 103  Resp: (!) 22 17  Temp:    SpO2: 97% 97%     General: Awake, no distress.  HEENT: Normocephalic, atraumatic, no midline neck  pain CV:  Good peripheral perfusion.  Tachycardic, regular rhythm, RP 2+ Resp:  Normal effort. CTAB Abd:  No distention. Nontender to deep palpation throughout    ED Results / Procedures / Treatments   Labs (all labs ordered are listed, but only abnormal results are displayed) Labs Reviewed  COMPREHENSIVE METABOLIC PANEL WITH GFR - Abnormal; Notable for the following components:      Result Value   Sodium 131 (*)    CO2 19 (*)    Glucose, Bld 203 (*)    BUN 42 (*)    Creatinine, Ser 2.35 (*)    Calcium 8.7 (*)    AST 176 (*)    ALT 66 (*)    Total Bilirubin 1.4 (*)    GFR, Estimated 27 (*)    All other components within normal limits  CBC WITH DIFFERENTIAL/PLATELET - Abnormal; Notable for the following components:   RBC 3.69 (*)    Hemoglobin 11.6 (*)    HCT 34.5 (*)    Neutro  Abs 8.4 (*)    Lymphs Abs 0.3 (*)    All other components within normal limits  CK - Abnormal; Notable for the following components:   Total CK 3,999 (*)    All other components within normal limits  D-DIMER, QUANTITATIVE - Abnormal; Notable for the following components:   D-Dimer, Quant 4.71 (*)    All other components within normal limits  LIPASE, BLOOD  PROTIME-INR  URINALYSIS, COMPLETE (UACMP) WITH MICROSCOPIC  TROPONIN I (HIGH SENSITIVITY)  TROPONIN I (HIGH SENSITIVITY)     EKG  Initial EKG concerning for A-fib though notable artifact.  Repeat EKG with A-fib, rate 119, no gross ST elevation or depression,  interventricular conduction delay present, left axis deviation, no clear evidence of ischemia on my read  No prior for comparison though no known history of A-fib.    RADIOLOGY Radiology interpreted by myself and radiology report reviewed.  No acute pathology identified.    PROCEDURES:  Critical Care performed: Yes, see critical care procedure note(s)  .Critical Care  Performed by: Clarine Ozell LABOR, MD Authorized by: Clarine Ozell LABOR, MD   Critical care provider  statement:    Critical care time (minutes):  30   Critical care time was exclusive of:  Separately billable procedures and treating other patients   Critical care was necessary to treat or prevent imminent or life-threatening deterioration of the following conditions:  Circulatory failure   Critical care was time spent personally by me on the following activities:  Development of treatment plan with patient or surrogate, discussions with consultants, evaluation of patient's response to treatment, examination of patient, ordering and review of laboratory studies, ordering and review of radiographic studies, ordering and performing treatments and interventions, pulse oximetry, re-evaluation of patient's condition and review of old charts   I assumed direction of critical care for this patient from another provider in my specialty: no      MEDICATIONS ORDERED IN ED: Medications  sodium chloride  0.9 % bolus 500 mL (0 mLs Intravenous Stopped 06/01/24 2059)  sodium chloride  0.9 % bolus 1,000 mL (1,000 mLs Intravenous New Bag/Given 06/01/24 2247)  metoprolol  tartrate (LOPRESSOR ) injection 5 mg (5 mg Intravenous Given 06/01/24 2254)  metoprolol  tartrate (LOPRESSOR ) tablet 25 mg (25 mg Oral Given 06/01/24 2320)     IMPRESSION / MDM / ASSESSMENT AND PLAN / ED COURSE  I reviewed the triage vital signs and the nursing notes.                              DDX/MDM/AP: Differential diagnosis includes, but is not limited to, consider fall secondary to recent arrhythmia though this may also be due to dehydration, underlying infection.  Doubt underlying pneumothorax.  No clear evidence of traumatic injuries from fall, no external evidence of head trauma and patient does not think he hit his head but is not entirely sure.  Mentating very well here about 24 hours after fall with no clear neurologic deficits.  Consider possibility of electrolyte abnormality, anemia, underlying infection though do not suspect  intra-abdominal pathology.  Also consider PE.  Consider rhabdomyolysis.  Plan: - Labs EKG - Cardiac monitor - Small bolus IV fluid, low threshold to initiate IV rate control - Given fall, prolonged downtime, new arrhythmia low threshold for admission  Patient's presentation is most consistent with acute presentation with potential threat to life or bodily function.  The patient is on the cardiac monitor to evaluate for evidence  of arrhythmia and/or significant heart rate changes.  ED course below.  Patient remained in A-fib RVR despite fluid resuscitation.  A-fib does appear to be a new diagnosis for him.  Responded well to IV metoprolol  and loaded with p.o. metoprolol , heart rate improved to about 100.  Was unable to get CT PE due to low GFR though CT chest Noncon unremarkable as well as CT head, no evidence of traumatic injuries.  I reiterated multiple times that I believe patient should be admitted into the hospital given his rhabdomyolysis and new onset A-fib, recent fall, having been down on the ground for a full day but he is adamant that he would like to be discharged AGAINST MEDICAL ADVICE.  Understands risks of clinical decompensation including death.  Discussed extensively with bedside nurse present as well.  Patient has decision-making capacity on my evaluation.  Will proceed with discharge AGAINST MEDICAL ADVICE.  He is amenable to finishing fluids prior to discharge.  Will start him on metoprolol .  At this time I will defer anticoagulation given he is here for a fall and do believe is a recurrent fall risk.  Clinical Course as of 06/01/24 2332  Sat Jun 01, 2024  2021 CBC with no leukocytosis, improved anemia [MM]  2106 Dimer markedly elevated, new A-fib --will escalate to CT PE [MM]  2131 CMP with mild bump in creatinine, somewhat low bicarb, new mild transaminitis -- getting IV fluid [MM]  2132 CK notably elevated, consistent with rhabdo, getting IV fluid  Potassium normal [MM]   2239 CT Chest: IMPRESSION: 1. No acute cardiopulmonary process. 2. 2 mm right solid pulmonary nodule. No follow-up needed if patient is low-risk.This recommendation follows the consensus statement: Guidelines for Management of Incidental Pulmonary Nodules Detected on CT Images: From the Fleischner Society 2017; Radiology 2017; 284:228-243. 3. Mild cardiomegaly.   [MM]  2239 CTH: IMPRESSION: 1. No acute intracranial abnormality. 2. Chronic microangiopathic white matter disease. 3. Cerebral volume loss.   [MM]  2244 Patient remains in A-fib RVR despite IV fluids, heart rate currently in the 120s  Giving further IV fluid and will give dose of IV metoprolol   He is very hesitant about potential admission, I will discuss this with him again [MM]  2308 Heart rate improved to about 100, will load with oral metoprolol  [MM]    Clinical Course User Index [MM] Clarine Ozell LABOR, MD     FINAL CLINICAL IMPRESSION(S) / ED DIAGNOSES   Final diagnoses:  Non-traumatic rhabdomyolysis  Atrial fibrillation, unspecified type (HCC)  Left against medical advice     Rx / DC Orders   ED Discharge Orders          Ordered    metoprolol  tartrate (LOPRESSOR ) 25 MG tablet  2 times daily        06/01/24 2330    Ambulatory referral to Cardiology        06/01/24 2331             Note:  This document was prepared using Dragon voice recognition software and may include unintentional dictation errors.   Clarine Ozell LABOR, MD 06/01/24 [phone]

## 2024-06-01 NOTE — ED Notes (Signed)
 Fall risk bundle in place.

## 2024-06-01 NOTE — Discharge Instructions (Signed)
 Your evaluation in the emergency department today was notable for an abnormal heart rhythm called atrial fibrillation as well as muscle damage (rhabdomyolysis).  I strongly recommended you stay in the hospital but you are leaving AGAINST MEDICAL ADVICE.  I have prescribed you a medication to help control your heart rate I have also placed a referral for you to follow-up with a cardiologist.  Please follow-up closely with your primary care team for reevaluation and return to the emergency department with any new or worsening symptoms or if you are amenable to admission.

## 2024-06-02 NOTE — ED Notes (Signed)
 Pt was educated, at length, by Dr. Clarine of the need to be admitted and treated. He also educated him on the dangers of leaving AMA, up to and including death. Pt chose to leave. I witnessed the conversations.

## 2024-06-05 DIAGNOSIS — R339 Retention of urine, unspecified: Secondary | ICD-10-CM | POA: Diagnosis not present

## 2024-06-07 ENCOUNTER — Telehealth: Payer: Self-pay

## 2024-06-07 ENCOUNTER — Ambulatory Visit: Payer: Self-pay

## 2024-06-07 NOTE — Telephone Encounter (Signed)
 FYI Only or Action Required?: Action required by provider: update on patient condition.- CAL notified  Patient was last seen in primary care on 04/09/2024 by Antonette Angeline ORN, NP.  Called Nurse Triage reporting Dysuria.  Symptoms began several days ago.  Interventions attempted: Rest, hydration, or home remedies.  Symptoms are: gradually worsening.  Triage Disposition: Go to ED Now (or PCP Triage)  Patient/caregiver understands and will follow disposition?: No, refuses disposition   RN advised pt's son to take him to the ER. At the time he refused and stated that pt was in the ER on Saturday and given 2 liters of IV fluids. They recommended pt be admitted but he refused. Pt is now confused, fever, dry mouth so RN recommended re-eval in ER.     Copied from CRM [id number]. Topic: Clinical - Red Word Triage >> Jun 07, 2024  9:33 AM Debby BROCKS wrote: Red Word that prompted transfer to Nurse Triage: Hector Simmons (Son) is calling on behalf of the patient. He believes the patient has a UTI and no appettit in about 3 days. Would like to get assistance   Reason for Disposition  [1] Decreased urination and [2] drinking very little AND [3] dehydration suspected (e.g., dark urine, no urine > 12 hours, very dry mouth, very lightheaded)  Answer Assessment - Initial Assessment Questions Pt has been having decreased appetite for about 3 days. Pt's son called with the information and stated that he went through this before and thinks it's a UTI. Pt today has a fever 102. Pt is fatigued and confused and has a hx of kidney issues. Pt's son did say that pt was in the ER on Saturday and was given 2 liters of fluids they wanted to admit him then but pt refused.     1. SYMPTOM: What's the main symptom you're concerned about? (e.g., frequency, incontinence)     Fever, confusion,  2. ONSET: When did the  fever  start?     3 days 3. PAIN: Is there any pain? If Yes, ask: How bad is it? (Scale:  1-10; mild, moderate, severe)     unsure 4. CAUSE: What do you think is causing the symptoms?     They believe  5. OTHER SYMPTOMS: Do you have any other symptoms? (e.g., blood in urine, fever, flank pain, pain with urination)     Fever, no blood  Protocols used: Urinary Symptoms-A-AH

## 2024-06-07 NOTE — Telephone Encounter (Signed)
 FYI Only or Action Required?: FYI only for provider.  Patient was last seen in primary care on 04/09/2024 by Antonette Angeline ORN, NP.  Called Nurse Triage reporting Follow-up.  Symptoms began no triage, duplicate call.  Interventions attempted: Other: no triage, duplicate call.  Symptoms are: no triage, duplicate call.  Triage Disposition: Duplicate Contact Calls  Patient/caregiver understands and will follow disposition?:     Copied from CRM [id number]. Topic: Clinical - Red Word Triage >> Jun 07, 2024  9:33 AM Hector Simmons wrote: Red Word that prompted transfer to Nurse Triage: Hector Simmons (Son) is calling on behalf of the patient. He believes the patient has a UTI and no appettit in about 3 days. Would like to get assistance >> Jun 07, 2024  1:53 PM Hector Simmons wrote: Reason for CRM: Patient's son calling to speak with Hector Simmons in the clinic. Attempting to get appointment today for patient to be seen for UTI. Patient already triaged today with [facility] but refusing to go to urgent care. PCP is out today and no appointments available at clinic. Called CAL and spoke with Hector Simmons. Hector Simmons is gone for the day. Per Hector Simmons, patient had just left the clinic and refused to take available appointment at [facility] clinic. Relayed information to son.  Reason for Disposition  Caller has already spoken with another triager and has no further questions.  Answer Assessment - Initial Assessment Questions Spoke with son Hector Simmons he stated that they are all set and have it figured out  Protocols used: No Contact or Duplicate Contact Call-A-AH

## 2024-06-07 NOTE — Telephone Encounter (Signed)
 Spoke with son and advised that patient needed to be seen and evaluated given his current symptoms. Son states patient is refusing to go to the ED. I advised him we had no appts in office and Angeline is not in office today. I advised at least taking him to an urgent care to be evaluated if he is not wanting to go to the ED. Son verbalized understanding. Will take patient to urgent care.

## 2024-06-07 NOTE — Telephone Encounter (Signed)
 Patient came into the office, he was offered appointments at [facility] medical. He refused.

## 2024-06-07 NOTE — Telephone Encounter (Signed)
 Copied from CRM #[id number]. Topic: Appointments - Scheduling Inquiry for Clinic >> Jun 07, 2024 12:39 PM Rosaria E wrote: Reason for CRM: Pt's son called requesting for the patient to be seen in office today for a urine sample for a potential UTI. Pt's son wants to know if he could be seen considering that his PCP is out of the office. Pt's son will call back to discuss if this is possible after 1pm.    ----------------------------------------------------------------------- From previous Reason for Contact - Scheduling: Patient/patient representative is calling to schedule an appointment. Refer to attachments for appointment information.

## 2024-06-10 ENCOUNTER — Ambulatory Visit: Payer: Self-pay

## 2024-06-10 NOTE — Telephone Encounter (Signed)
 Did he make an appt to be seen?

## 2024-06-10 NOTE — Telephone Encounter (Signed)
 No, that's okay. I just wanted to make sure he had something scheduled

## 2024-06-10 NOTE — Telephone Encounter (Signed)
 FYI Only or Action Required?: FYI only for provider. - will only see Angeline.  Patient was last seen in primary care on 04/09/2024 by Antonette Angeline ORN, NP.  Called Nurse Triage reporting Fall.  Symptoms began 9 days ago.  Interventions attempted: Other: went to ED.  Symptoms are: gradually improving.  Triage Disposition: See PCP Within 2 Weeks  Patient/caregiver understands and will follow disposition?: Yes                          Copied from CRM [id number]. Topic: Clinical - Red Word Triage >> Jun 10, 2024 12:06 PM Fonda T wrote: [facility] that prompted transfer to Nurse Triage: Patient called, state he had a fall in stall shower, and couldn't get out, hit his head, and laid there for 15 hours. Unsure if lost consciousness or not, has he states he does not remember anything. Patient reports he lives alone,and needs to be seen as soon as possible. Reason for Disposition  [1] Fall AND [2] went to emergency department for evaluation or treatment  Answer Assessment - Initial Assessment Questions 1. MECHANISM: How did the fall happen?     Fell in shower - slipped in shower 2. DOMESTIC VIOLENCE AND ELDER ABUSE SCREENING: Did you fall because someone pushed you or tried to hurt you? If Yes, ask: Are you safe now?     no 3. ONSET: When did the fall happen? (e.g., minutes, hours, or days ago)     1 week ago - not sure 4. LOCATION: What part of the body hit the ground? (e.g., back, buttocks, head, hips, knees, hands, head, stomach)     head 5. INJURY: Did you hurt (injure) yourself when you fell? If Yes, ask: What did you injure? Tell me more about this? (e.g., body area; type of injury; pain severity)     Hit head 6. PAIN: Is there any pain? If Yes, ask: How bad is the pain? (e.g., Scale 0-10; or none, mild,      *No Answer* 7. SIZE: For cuts, bruises, or swelling, ask: How large is it? (e.g., inches or centimeters)      unsure  9. OTHER SYMPTOMS:  Do you have any other symptoms? (e.g., dizziness, fever, weakness; new-onset or worsening).      Thinks he is ok. 10. CAUSE: What do you think caused the fall (or falling)? (e.g., dizzy spell, tripped)       Slipped in the shower  Protocols used: Falls and [facility]

## 2024-06-11 ENCOUNTER — Ambulatory Visit: Admitting: Dermatology

## 2024-06-11 DIAGNOSIS — D692 Other nonthrombocytopenic purpura: Secondary | ICD-10-CM | POA: Diagnosis not present

## 2024-06-11 DIAGNOSIS — L821 Other seborrheic keratosis: Secondary | ICD-10-CM | POA: Diagnosis not present

## 2024-06-11 DIAGNOSIS — W908XXA Exposure to other nonionizing radiation, initial encounter: Secondary | ICD-10-CM

## 2024-06-11 DIAGNOSIS — L578 Other skin changes due to chronic exposure to nonionizing radiation: Secondary | ICD-10-CM | POA: Diagnosis not present

## 2024-06-11 DIAGNOSIS — L570 Actinic keratosis: Secondary | ICD-10-CM | POA: Diagnosis not present

## 2024-06-11 NOTE — Progress Notes (Signed)
 Follow-Up Visit   Subjective  Hector Simmons is a 83 y.o. male who presents for the following: 6 month follow of AK's.    The following portions of the chart were reviewed this encounter and updated as appropriate: medications, allergies, medical history  Review of Systems:  No other skin or systemic complaints except as noted in HPI or Assessment and Plan.  Objective  Well appearing patient in no apparent distress; mood and affect are within normal limits.   A focused examination was performed of the following areas: Face, Scalp  Relevant exam findings are noted in the Assessment and Plan.  Mid Parietal Scalp x10, left cheek x2, left tragus x1, Right cheek x1, Right dorsal hand x1 (15) Pink scaly macules  Assessment & Plan   ACTINIC DAMAGE - chronic, secondary to cumulative UV radiation exposure/sun exposure over time - diffuse scaly erythematous macules with underlying dyspigmentation - Recommend daily broad spectrum sunscreen SPF 30+ to sun-exposed areas, reapply every 2 hours as needed.  - Recommend staying in the shade or wearing long sleeves, sun glasses (UVA+UVB protection) and wide brim hats (4-inch brim around the entire circumference of the hat). - Call for new or changing lesions.  Purpura - Chronic; persistent and recurrent.  Treatable, but not curable. - Violaceous macules and patches at the bilateral arms - Benign - Related to trauma, age, sun damage and/or use of blood thinners, chronic use of topical and/or oral steroids - Observe - Can use OTC arnica containing moisturizer such as Dermend Bruise Formula if desired - Call for worsening or other concerns  SEBORRHEIC KERATOSIS - Stuck-on, waxy, tan-brown papules and/or plaques  - Benign-appearing - Discussed benign etiology and prognosis. 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 They are chronic with expected duration over 1 year. A portion of actinic keratoses will progress to squamous cell carcinoma of the skin. It is not possible to reliably predict which spots will progress to skin cancer and so treatment is recommended to prevent development of skin cancer.  Recommend daily broad spectrum sunscreen SPF 30+ to sun-exposed areas, reapply every 2 hours as needed.  Recommend staying in the shade or wearing long sleeves, sun glasses (UVA+UVB protection) and wide brim hats (4-inch brim around the entire circumference of the hat). Call for new or changing lesions.  Discussed topical field treatment Aks with chemo cream or PDT, pt is not interested at this time Destruction of lesion - Mid Parietal Scalp x10, left cheek x2, left tragus x1, Right cheek x1, Right dorsal hand x1 (15)  Destruction method: cryotherapy   Informed consent: discussed and consent obtained   Lesion destroyed using liquid nitrogen: Yes   Region frozen until ice ball extended beyond lesion: Yes   Outcome: patient tolerated procedure well with no complications   Post-procedure details: wound care instructions given   Additional details:  Prior to procedure, discussed risks of blister formation, small wound, skin dyspigmentation, or rare scar following cryotherapy. Recommend Vaseline ointment to treated areas while healing.     Return in about 6 months (around 12/09/2024) for UBSE.  I, Emerick Ege, CMA am acting as scribe for Rexene Rattler, MD.   Documentation: I have reviewed the above documentation for accuracy and completeness, and I agree with the above.  Rexene Rattler, MD

## 2024-06-11 NOTE — Patient Instructions (Signed)

## 2024-06-12 DIAGNOSIS — N184 Chronic kidney disease, stage 4 (severe): Secondary | ICD-10-CM | POA: Diagnosis not present

## 2024-06-12 DIAGNOSIS — R809 Proteinuria, unspecified: Secondary | ICD-10-CM | POA: Diagnosis not present

## 2024-06-12 DIAGNOSIS — D631 Anemia in chronic kidney disease: Secondary | ICD-10-CM | POA: Diagnosis not present

## 2024-06-12 DIAGNOSIS — I129 Hypertensive chronic kidney disease with stage 1 through stage 4 chronic kidney disease, or unspecified chronic kidney disease: Secondary | ICD-10-CM | POA: Diagnosis not present

## 2024-06-12 DIAGNOSIS — E875 Hyperkalemia: Secondary | ICD-10-CM | POA: Diagnosis not present

## 2024-06-13 ENCOUNTER — Ambulatory Visit: Attending: Internal Medicine

## 2024-06-13 ENCOUNTER — Ambulatory Visit: Admitting: Internal Medicine

## 2024-06-13 ENCOUNTER — Encounter: Payer: Self-pay | Admitting: Internal Medicine

## 2024-06-13 VITALS — Ht 73.0 in | Wt 200.0 lb

## 2024-06-13 DIAGNOSIS — N1832 Chronic kidney disease, stage 3b: Secondary | ICD-10-CM

## 2024-06-13 DIAGNOSIS — R001 Bradycardia, unspecified: Secondary | ICD-10-CM | POA: Diagnosis not present

## 2024-06-13 DIAGNOSIS — E559 Vitamin D deficiency, unspecified: Secondary | ICD-10-CM | POA: Diagnosis not present

## 2024-06-13 DIAGNOSIS — I480 Paroxysmal atrial fibrillation: Secondary | ICD-10-CM

## 2024-06-13 DIAGNOSIS — E538 Deficiency of other specified B group vitamins: Secondary | ICD-10-CM

## 2024-06-13 DIAGNOSIS — R0609 Other forms of dyspnea: Secondary | ICD-10-CM

## 2024-06-13 DIAGNOSIS — R630 Anorexia: Secondary | ICD-10-CM

## 2024-06-13 DIAGNOSIS — R600 Localized edema: Secondary | ICD-10-CM

## 2024-06-13 DIAGNOSIS — R9431 Abnormal electrocardiogram [ECG] [EKG]: Secondary | ICD-10-CM

## 2024-06-13 DIAGNOSIS — R531 Weakness: Secondary | ICD-10-CM | POA: Diagnosis not present

## 2024-06-13 NOTE — Progress Notes (Signed)
 Subjective:    Patient ID: Hector Simmons, male    DOB: 18-Oct-1940, 83 y.o.   MRN: [id number]  HPI  Discussed the use of AI scribe software for clinical note transcription with the patient, who gave verbal consent to proceed.   Hector Simmons is an 83 year old male with newly diagnosed atrial fibrillation who presents with fatigue and weakness after hospital followup.  He experienced a fall in the shower on May 31, 2024, and remained on the floor for approximately 14 hours until discovered by a neighbor. He reports he slipped in the shower and fell. He did not experience pain during the fall but was unable to get up. He was subsequently taken to the hospital where he was found to be in atrial fibrillation, dehydrated, and had elevated CK levels of 3,999 indicating rhabdomyolysis. Labs showed a creatinine of 2.35 with a GFR of 27. Calcium was low at 8.7. AST/ALT was 176/66. He had a chronic anemia with an H/H of 11.6/34.5. His D-dimer was elevated, but he did not experience cough or shortness of breath. Due to severe kidney function, a CT chest with contrast could not be performed to rule out PE. He was given a 500 mL bolus of fluid. ECG showed new onset afib, with ST elevation. Troponin was 97. It was recommended that he be admitted by he left AMA.  He currently feels very tired and weak, with difficulty walking across the room. He experiences shortness of breath and decreased appetite. He reports swelling in his feet, though he does not perceive them as swollen. He typically walks but is currently using a wheelchair. He has not been taking any new medications since his hospital visit, including metoprolol , which was prescribed but not picked up.  He lives alone and has not been visited by others since his hospital discharge. His social history includes living alone and having a caregiver who visits on Thursdays to assist with household tasks. He has a history of smoking,  having quit 50 years ago. No evidence of heart failure.  He saw Dr. Jaclynn in followup yesterday. CK had normalized. Creatinine and GFR had improved to 1.62/42. Calcium remained low at 8.5. ALT remained elevated at 76 but AST had returned to normal.       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 Continue current treatment regimen. Dietary sodium restriction. Regular aerobic exercise. Continue current medications.    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 Continue current treatment regimen.    CN (constipation) 01/22/2014   Last Assessment & Plan:  Start PEG powder every day.  incr water (hard w his situation fo self cathing).     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  Last Assessment & Plan:  Continue current exercise regimen to maximize lower extremity strength.  Discussed PT with patient, but patient reports being able to do exercises on his own as taught.      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  Last Assessment & Plan:  Refer to podiatry one time per year.      Current Outpatient Medications  Medication Sig Dispense Refill   amLODipine  (NORVASC ) 5 MG tablet TAKE 1 TABLET EVERY DAY 90 tablet 1   cephALEXin  (KEFLEX ) 500 MG capsule Take 1 capsule (500 mg total) by mouth 3 (three) times daily. 30 capsule 0   lovastatin  (MEVACOR ) 40 MG tablet TAKE 1 TABLET AT BEDTIME 90 tablet 1   metoprolol  tartrate (LOPRESSOR ) 25  MG tablet Take 1 tablet (25 mg total) by mouth 2 (two) times daily. 60 tablet 0   mometasone  (ELOCON ) 0.1 % lotion Apply topically daily. Mix entire bottle into a 1 lbs jar of over the counter CeraVe cream and apply to itchy rash QD-BID PRN. 60 mL 0   omeprazole  (PRILOSEC) 20 MG capsule TAKE 1 CAPSULE EVERY DAY 90 capsule 1   oxybutynin  (DITROPAN ) 5 MG tablet Take 1 tablet (5 mg total) by mouth 3 (three) times daily. 270 tablet 3   polyethylene glycol (MIRALAX / GLYCOLAX) 17 g packet Take 17 g by mouth daily as needed for moderate constipation.     traZODone  (DESYREL ) 50 MG tablet TAKE 1 TABLET AT BEDTIME AS NEEDED FOR SLEEP 90 tablet 1   trimethoprim  (TRIMPEX ) 100 MG tablet Take 1 tablet (100 mg total) by mouth daily. 90 tablet 3   No current facility-administered medications for this visit.    Allergies  Allergen Reactions   No Known Allergies     Family History  Problem Relation Age of Onset   Stroke Father    Heart disease Father     Social History   Socioeconomic History   Marital status: Divorced    Spouse name: Not on file   Number of children: Not on file   Years of education: Not on file   Highest education level: Not on file  Occupational History   Not on file  Tobacco Use   Smoking status: Former   Smokeless tobacco: Never  Substance and Sexual Activity   Alcohol use: Yes    Alcohol/week: 0.0 standard drinks of alcohol   Drug use: No   Sexual activity: Not on file  Other Topics Concern   Not on file  Social History Narrative   Not on file   Social Drivers of Health   Financial Resource Strain: Low Risk  (12/22/2023)   Overall Financial Resource Strain (CARDIA)    Difficulty of Paying Living Expenses: Not hard at all  Food Insecurity: No Food Insecurity (12/22/2023)   Hunger Vital Sign    Worried About Running Out of Food in the Last Year: Never true    Ran Out of Food in the Last Year: Never true  Transportation Needs: No Transportation Needs (12/22/2023)    PRAPARE - Administrator, Civil Service (Medical): No    Lack of Transportation (Non-Medical): No  Physical Activity: Sufficiently Active (12/22/2023)   Exercise Vital Sign    Days of Exercise per Week: 4 days    Minutes of Exercise per Session: 60 min  Stress: No Stress Concern Present (12/22/2023)   [employer] of Occupational Health - Occupational Stress Questionnaire    Feeling of Stress : Only a little  Social Connections: Moderately Isolated (12/22/2023)   Social Connection and Isolation Panel    Frequency of Communication with Friends and Family: More than three times a week    Frequency of Social  Gatherings with Friends and Family: Twice a week    Attends Religious Services: More than 4 times per year    Active Member of [employer] or Organizations: No    Attends Banker Meetings: Never    Marital Status: Divorced  Catering manager Violence: Not At Risk (12/22/2023)   Humiliation, Afraid, Rape, and Kick questionnaire    Fear of Current or Ex-Partner: No    Emotionally Abused: No    Physically Abused: No    Sexually Abused: No     Constitutional: Pt reports fatigue. Denies fever, malaise, headache or abrupt weight changes.  HEENT: Denies eye pain, eye redness, ear pain, ringing in the ears, wax buildup, runny nose, nasal congestion, bloody nose, or sore throat. Respiratory: Pt reports shortness of breath with exertion. Denies difficulty breathing, cough or sputum production.   Cardiovascular: Pt reports swelling in legs. Denies chest pain, chest tightness, palpitations or swelling in the hands.  Gastrointestinal:  Denies abdominal pain, bloating, constipation, diarrhea or blood in the stool.  GU: Denies urgency, frequency, pain with urination, burning sensation, blood in urine, odor or discharge. Musculoskeletal: Patient reports difficulty with gait, generalized weakness.  Denies decrease in range of motion, muscle pain or joint pain and swelling.   Skin: Denies redness, rashes, lesions or ulcercations.  Neurological: Patient reports neuropathic pain, difficulty with balance.  Denies dizziness, difficulty with memory, difficulty with speech or problems coordination.  Psych: Denies anxiety, depression, SI/HI.  No other specific complaints in a complete review of systems (except as listed in HPI above).      Objective:   Physical Exam Ht 6' 1 (1.854 m)   Wt 200 lb (90.7 kg)   SpO2 95%   BMI 26.39 kg/m    Wt Readings from Last 3 Encounters:  06/01/24 200 lb (90.7 kg)  04/09/24 206 lb (93.4 kg)  02/29/24 205 lb 6.4 oz (93.2 kg)    General: Appears his stated age, overweight, chronically ill appearing, in NAD. Skin: Warm, dry and intact.  Pale. HEENT: Head: normal shape and size; Eyes: sclera white, no icterus, conjunctiva pink, PERRLA and EOMs intact;  Cardiovascular: Bradycardic with normal rhythm. S1,S2 noted.  No murmur, rubs or gallops noted. No JVD. Trace pitting BLE edema.  Pulmonary/Chest: Normal effort and diminished breath sounds. No respiratory distress. No wheezes, rales or ronchi noted.  Abdomen: Soft and nontender.  Musculoskeletal: He has difficulty getting from a sitting to a standing position. Difficulty getting up on exam table. Strength 5/5 BUE, 4/5 BLE. In wheelchair today. Neurological: Alert and oriented. Coordination normal.     BMET    Component Value Date/Time   NA 131 (L) 06/01/2024 2010   K 4.3 06/01/2024 2010   CL 100 06/01/2024 2010   CO2 19 (L) 06/01/2024 2010   GLUCOSE 203 (H) 06/01/2024 2010   BUN 42 (H) 06/01/2024 2010   CREATININE 2.35 (H) 06/01/2024 2010   CREATININE 2.26 (H) 01/09/2024 0845   CALCIUM 8.7 (L) 06/01/2024 2010   GFRNONAA 27 (L) 06/01/2024 2010    Lipid Panel     Component Value Date/Time   CHOL 141 12/22/2023 0906   TRIG 75 12/22/2023 0906   HDL 60 12/22/2023 0906   CHOLHDL 2.4 12/22/2023 0906   LDLCALC 65 12/22/2023 0906    CBC    Component Value  Date/Time   WBC 9.7 06/01/2024 2010   RBC 3.69 (L) 06/01/2024 2010   HGB 11.6 (L) 06/01/2024 2010   HCT 34.5 (L) 06/01/2024 2010  PLT 157 06/01/2024 2010   MCV 93.5 06/01/2024 2010   MCH 31.4 06/01/2024 2010   MCHC 33.6 06/01/2024 2010   RDW 14.2 06/01/2024 2010   LYMPHSABS 0.3 (L) 06/01/2024 2010   MONOABS 1.0 06/01/2024 2010   EOSABS 0.0 06/01/2024 2010   BASOSABS 0.0 06/01/2024 2010    Hgb A1C Lab Results  Component Value Date   HGBA1C 5.7 (H) 12/22/2023            Assessment & Plan:   Assessment and Plan    Hospital followup for fall, weakness, atrial fibrillation with bradycardia: Hospital notes, labs and imaging reviewed. Orthostatic hypotension suspected due to low blood pressure and dizziness upon standing.  - Indication for ECG: Bradycardia -Interpretation of ECG: bradycardia with rate of 41, A-V disassociation -Comparison of ECG: 05/2024, prior showed afib with rate of 119, st elevation - Orthostatics negative - Order 2D echocardiogram to evaluate heart function and rule out heart failure. - Order ZIO patch 3-day heart monitor to capture arrhythmias. - Ensure cardiology referral is in place. - Stop metoprolol  25 mg BID and hold amlodipine  5 mg due to hypotension. - Monitor blood pressure and heart rate at home. -Urgent referral to cardiology, may need pacemaker  Lower extremity edema Swelling in feet, possibly related to recent injury. - Order echocardiogram to assess for heart failure. -Check BNP today -Unable to use diuretics in the setting of CKD  Generalized weakness and decreased appetite Generalized weakness and decreased appetite, possibly related to recent hospitalization and atrial fibrillation. Slight improvement in hand weakness reported. - Check vitamin D  and B12 levels. - Referral for home health for physical therapy and aide support.     RTC in 1 week for follow-up of chronic conditions Angeline Laura, NP

## 2024-06-13 NOTE — Patient Instructions (Addendum)
 Please do not start Metoprolol  (decreases heart rate and blood pressure) Hold amlodipine  for now I am checking bnp, vit d and b12 today, will call you tomorrow with these results Your orthostatics were negative. Make sure you drink plenty of fluids and make position changes slowly. Your ECG does not show evidence of afib today but that does not mean you do not have afib. Your heart rate is still very low. I have ordered a ZIO patch monitor that will be mailed to your house. I am also reaching out to cardiology to see if you can be sooner rather than later. I have ordered an echocardiogram, someone should reach out to you to get this scheduled.     Bradycardia, Adult Bradycardia is a slower-than-normal heartbeat. A normal resting heart rate for an adult ranges from 60 to 100 beats per minute. With bradycardia, the resting heart rate is less than 60 beats per minute. Bradycardia can prevent enough oxygen from reaching certain areas of your body when you are active. It can be serious if it keeps enough oxygen from reaching your brain and other parts of your body. Bradycardia is not a problem for everyone. For some healthy adults, a slow resting heart rate is normal. What are the causes? This condition may be caused by: A problem with the heart, including: A problem with the heart's electrical system, such as a heart block. With a heart block, electrical signals between the chambers of the heart are partially or completely blocked, so they are not able to work as they should. A problem with the heart's natural pacemaker (sinus node). Heart disease. A heart attack. Heart damage. Lyme disease. A heart infection. A heart condition that is present at birth (congenital heart defect). Certain medicines that treat heart conditions. Certain conditions, such as hypothyroidism and obstructive sleep apnea. Problems with the balance of chemicals and other substances, like potassium, in the  blood. Trauma. Radiation therapy. What increases the risk? You are more likely to develop this condition if you: Are age 14 or older. Have high blood pressure (hypertension), high cholesterol (hyperlipidemia), or diabetes. Drink heavily, use tobacco or nicotine products, or use drugs. What are the signs or symptoms? Symptoms of this condition include: Light-headedness. Feeling faint or fainting. Fatigue and weakness. Trouble with activity or exercise. Shortness of breath. Chest pain (angina). Drowsiness. Confusion. Dizziness. How is this diagnosed? This condition may be diagnosed based on: Your symptoms. Your medical history. A physical exam. During the exam, your health care provider will listen to your heartbeat and check your pulse. To confirm the diagnosis, your health care provider may order tests, such as: Blood tests. An electrocardiogram (ECG). This test records the heart's electrical activity. The test can show how fast your heart is beating and whether the heartbeat is steady. A test in which you wear a portable device (event recorder or Holter monitor) to record your heart's electrical activity while you go about your day. An exercise test. How is this treated? Treatment for this condition depends on the cause of the condition and how severe your symptoms are. Treatment may involve: Treatment of the underlying condition. Changing your medicines or how much medicine you take. Having a small, battery-operated device called a pacemaker implanted under the skin. When bradycardia occurs, this device can be used to increase your heart rate and help your heart beat in a regular rhythm. Follow these instructions at home: Lifestyle Manage any health conditions that contribute to bradycardia as told by your health  care provider. Follow a heart-healthy diet. A nutrition specialist (dietitian) can help educate you about healthy food options and changes. Follow an exercise program  that is approved by your health care provider. Maintain a healthy weight. Try to reduce or manage your stress, such as with yoga or meditation. If you need help reducing stress, ask your health care provider. Do not use any products that contain nicotine or tobacco. These products include cigarettes, chewing tobacco, and vaping devices, such as e-cigarettes. If you need help quitting, ask your health care provider. Do not use illegal drugs. Alcohol use If you drink alcohol: Limit how much you have to: 0-1 drink a day for women who are not pregnant. 0-2 drinks a day for men. Know how much alcohol is in a drink. In the [place] one drink equals one 12 oz bottle of beer (355 mL), one 5 oz glass of wine (148 mL), or one 1 oz glass of hard liquor (44 mL). General instructions Take over-the-counter and prescription medicines only as told by your health care provider. Keep all follow-up visits. This is important. How is this prevented? In some cases, bradycardia may be prevented by: Treating underlying medical problems. Stopping behaviors or medicines that can trigger the condition. Contact a health care provider if: You feel light-headed or dizzy. You almost faint. You feel weak or are easily fatigued during physical activity. You experience confusion or have memory problems. Get help right away if: You faint. You have chest pains or an irregular heartbeat (palpitations). You have trouble breathing. These symptoms may represent a serious problem that is an emergency. Do not wait to see if the symptoms will go away. Get medical help right away. Call your local emergency services (911 in the [place] Do not drive yourself to the hospital. Summary Bradycardia is a slower-than-normal heartbeat. With bradycardia, the resting heart rate is less than 60 beats per minute. Treatment for this condition depends on the cause. Manage any health conditions that contribute to bradycardia as told by your health  care provider. Do not use any products that contain nicotine or tobacco. These products include cigarettes, chewing tobacco, and vaping devices, such as e-cigarettes. Keep all follow-up visits. This is important. This information is not intended to replace advice given to you by your health care provider. Make sure you discuss any questions you have with your health care provider. Document Revised: 12/20/2020 Document Reviewed: 12/20/2020 Elsevier Patient Education  2024 [employer].

## 2024-06-14 ENCOUNTER — Ambulatory Visit: Payer: Self-pay | Admitting: Internal Medicine

## 2024-06-14 LAB — VITAMIN D 25 HYDROXY (VIT D DEFICIENCY, FRACTURES): Vit D, 25-Hydroxy: 82 ng/mL (ref 30–100)

## 2024-06-14 LAB — BRAIN NATRIURETIC PEPTIDE: Brain Natriuretic Peptide: 369 pg/mL — ABNORMAL HIGH (ref <100)

## 2024-06-14 LAB — VITAMIN B12: Vitamin B-12: 1328 pg/mL — ABNORMAL HIGH (ref 200–1100)

## 2024-06-16 ENCOUNTER — Other Ambulatory Visit: Payer: Self-pay | Admitting: Internal Medicine

## 2024-06-18 ENCOUNTER — Telehealth: Payer: Self-pay | Admitting: *Deleted

## 2024-06-18 ENCOUNTER — Encounter: Payer: Self-pay | Admitting: *Deleted

## 2024-06-18 ENCOUNTER — Encounter: Payer: Self-pay | Admitting: Cardiology

## 2024-06-18 ENCOUNTER — Ambulatory Visit: Attending: Cardiology | Admitting: Cardiology

## 2024-06-18 VITALS — BP 136/53 | HR 48 | Ht 73.0 in | Wt 202.0 lb

## 2024-06-18 DIAGNOSIS — I10 Essential (primary) hypertension: Secondary | ICD-10-CM | POA: Diagnosis not present

## 2024-06-18 DIAGNOSIS — I4719 Other supraventricular tachycardia: Secondary | ICD-10-CM | POA: Diagnosis not present

## 2024-06-18 DIAGNOSIS — E7800 Pure hypercholesterolemia, unspecified: Secondary | ICD-10-CM

## 2024-06-18 DIAGNOSIS — I492 Junctional premature depolarization: Secondary | ICD-10-CM | POA: Diagnosis not present

## 2024-06-18 MED ORDER — APIXABAN 2.5 MG PO TABS: 2.5000 mg | ORAL_TABLET | Freq: Two times a day (BID) | ORAL | 3 refills | Status: AC

## 2024-06-18 NOTE — Telephone Encounter (Signed)
 Requested Prescriptions  Pending Prescriptions Disp Refills   traZODone  (DESYREL ) 50 MG tablet [Pharmacy Med Name: TRAZODONE  HYDROCHLORIDE 50 MG Oral Tablet] 90 tablet 0    Sig: TAKE 1 TABLET AT BEDTIME AS NEEDED FOR SLEEP     Psychiatry: Antidepressants - Serotonin Modulator Passed - 06/18/2024 12:18 PM      Passed - Valid encounter within last 6 months    Recent Outpatient Visits           5 days ago Bradycardia   [facility] [place], Angeline ORN, NP   2 months ago Open wound of left lower leg, initial encounter   [facility] [place], Angeline ORN, NP   3 months ago Paresthesia of both hands   [facility] [place], Angeline ORN, NP   5 months ago Encounter for general adult medical examination with abnormal findings   [place] [place], Angeline ORN, NP   6 months ago Fall, initial encounter   [place] [facility] [place], Angeline ORN, NP       Future Appointments             In 5 months Jackquline Sawyer, MD [facility]   In 8 months MacDiarmid, [place], MD [facility]

## 2024-06-18 NOTE — Progress Notes (Signed)
 Cardiology Office Note:  .   Date:  06/18/2024  ID:  Hector Simmons, DOB 1941/01/25, MRN [id number] PCP: Antonette Angeline ORN, NP  [facility] Providers Cardiologist:  None   History of Present Illness: .   Hector Simmons is a 83 y.o. Caucasian male patient with hypertension, hypercholesterolemia, stage IV chronic kidney disease, evaluated in the emergency room on 05/31/2024 after he was found on the floor for 14 hours reporting he slipped in the shower and fell.  He had mild rhabdomyolysis and acute renal failure and dehydration and also mild anemia.  EKG revealed new onset atrial fibrillation and was recommended hospital admission but left AMA and was evaluated by Ms. Angeline Antonette, NP and referred for further cardiac evaluation.  He continues to endorse marked generalized weakness and shortness of breath.  He is accompanied by his son, states that since the fall, he has deteriorated significantly with regard to his activities of daily living, marked generalized weakness, dyspnea with minimal activities.  No PND or orthopnea.  Cardiac Studies relevent.       Discussed the use of AI scribe software for clinical note transcription with the patient, who gave verbal consent to proceed.  History of Present Illness Hector Simmons is an 83 year old male with atrial tachycardia who presents for evaluation after a recent fall and irregular heart rhythm. He is accompanied by his son. He was referred by his primary care nurse practitioner, Angeline Barks, for evaluation of his irregular heart rhythm and recent medication changes.  Cardiac arrhythmia and hemodynamic instability - Irregular heart rhythm identified in the emergency room following a fall - History of atrial tachycardia - Amlodipine  and metoprolol  discontinued by primary care nurse practitioner due to low blood pressure - Heart murmur present since childhood  Fall and functional decline - Experienced a fall  in the bathroom and was unable to get up for 14 hours until found by a neighbor - Increased fatigue and reduced stamina, with exhaustion after walking across the bedroom - Difficulty rising quickly, worsened since the fall - Uses a four-legged walker for stability at home - Lives alone  Dyspnea and exercise intolerance - Shortness of breath with minimal exertion  Peripheral edema - Mild leg swelling  Cognitive changes and dehydration - Episode of confusion and dehydration three weeks ago, improved after eating - No history of diabetes - No recent urinary tract infection   Labs   Lab Results  Component Value Date   CHOL 141 12/22/2023   HDL 60 12/22/2023   LDLCALC 65 12/22/2023   TRIG 75 12/22/2023   CHOLHDL 2.4 12/22/2023   No results found for: LIPOA  Recent Labs    12/22/23 0906 01/09/24 0845 06/01/24 2010  NA 137 137 131*  K 5.8* 5.6* 4.3  CL 108 107 100  CO2 21 24 19*  GLUCOSE 113 102 203*  BUN 37* 35* 42*  CREATININE 1.99* 2.26* 2.35*  CALCIUM 9.2 9.1 8.7*  GFRNONAA  --   --  27*    Lab Results  Component Value Date   ALT 66 (H) 06/01/2024   AST 176 (H) 06/01/2024   ALKPHOS 69 06/01/2024   BILITOT 1.4 (H) 06/01/2024      Latest Ref Rng & Units 06/01/2024    8:10 PM 12/22/2023    9:06 AM 06/22/2023   10:27 AM  CBC  WBC 4.0 - 10.5 K/uL 9.7  7.3  7.0   Hemoglobin 13.0 - 17.0 g/dL 11.6  10.1  10.1   Hematocrit 39.0 - 52.0 % 34.5  30.9  31.0   Platelets 150 - 400 K/uL 157  200  200    Lab Results  Component Value Date   HGBA1C 5.7 (H) 12/22/2023    Lab Results  Component Value Date   TSH 1.43 02/29/2024    BNP (last 3 results) Recent Labs    06/13/24 1455  BNP 369*    ROS  Review of Systems  Constitutional: Positive for malaise/fatigue.  Cardiovascular:  Positive for dyspnea on exertion. Negative for chest pain, leg swelling, orthopnea, palpitations and syncope.   Physical Exam:   VS:  BP (!) 136/53 (BP Location: Left Arm, Patient  Position: Sitting, Cuff Size: Normal)   Pulse (!) 48   Ht 6' 1 (1.854 m)   Wt 202 lb (91.6 kg)   SpO2 97%   BMI 26.65 kg/m    Wt Readings from Last 3 Encounters:  06/18/24 202 lb (91.6 kg)  06/13/24 200 lb (90.7 kg)  06/01/24 200 lb (90.7 kg)    BP Readings from Last 3 Encounters:  06/18/24 (!) 136/53  06/02/24 127/84  04/09/24 122/64   Physical Exam Neck:     Vascular: No JVD.  Cardiovascular:     Rate and Rhythm: Regular rhythm. Bradycardia present. Occasional Extrasystoles are present.    Pulses: Intact distal pulses.          Carotid pulses are  on the right side with bruit and  on the left side with bruit.    Heart sounds: S1 normal and S2 normal. Murmur heard.     Early systolic murmur is present with a grade of 2/6 at the upper right sternal border radiating to the neck.     No gallop.  Pulmonary:     Effort: Pulmonary effort is normal.     Breath sounds: Normal breath sounds.  Abdominal:     General: Bowel sounds are normal.     Palpations: Abdomen is soft.  Musculoskeletal:     Right lower leg: Edema (1-2+ ankle edema) present.     Left lower leg: No edema (1-2+ ankle edema).    EKG:    EKG Interpretation Date/Time:  Tuesday June 18 2024 08:56:03 EDT Ventricular Rate:  48 PR Interval:    QRS Duration:  126 QT Interval:  470 QTC Calculation: 419 R Axis:   -71  Text Interpretation: EKG 8 06/18/2024: Underlying atrial tachycardia with Junctional escape at the rate of 50 bpm left anterior fascicular block.  Right bundle branch block.  Compared to 06/06/2024, , Previous atrial tachycardia with ventricular rate of 123 bpm and atrial complexes are much more prominent and appreciated. Reconfirmed by Margarete Horace, Jagadeesh [phone]) on 06/18/2024 6:23:01 PM    ASSESSMENT AND PLAN: .      ICD-10-CM   1. Atrial tachycardia  I47.19 EKG 12-Lead    apixaban (ELIQUIS) 2.5 MG TABS tablet    2. 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 Primary hypertension  I10 EKG  12-Lead    4. Pure hypercholesterolemia  E78.00 EKG 12-Lead      Assessment & Plan Atrial tachycardia with junctional escape rhythm Atrial tachycardia with irregular heart rhythm, likely contributing to fatigue due to increased heart rate in the upper chamber. Potential need for cardioversion to restore normal rhythm. Echocardiogram results pending to assess heart function before proceeding with cardioversion. CPR consent obtained in case of cardiac arrest during the procedure. - Start Eliquis  2.5 mg twice daily for anticoagulation - Order echocardiogram to assess heart function - Schedule cardioversion in three weeks in [place] - Zio patch monitoring has been ordered by PCP, instruct to wear a heart monitor and walk to assess heart rate response to exercise as patient has bradycardia today with junctional escape.  Beta-blocker and amlodipine  has been discontinued recently. - Patient is full code -He has no atrial fibrillation, no atrial flutter, hence does not need long-term anticoagulation.  Depends upon how his heart rate will respond off of beta-blocker therapy that was discontinued recently and also post cardioversion, if fatigue does not improve on restoration of sinus rhythm, he may need EP evaluation and possible pacemaker implantation. - Echocardiogram has been ordered and pending.  As patient lives in [place], will transfer care to our [place] office.  Essential hypertension, controlled off medication Blood pressure is currently normal without medication. Recent medication changes include stopping amlodipine  and metoprolol  due to low blood pressure.  Pure hypercholesterolemia, well controlled on statin therapy Cholesterol levels are well controlled on lovastatin .  Anemia, unspecified etiology Mild anemia with no clear etiology identified. Anemia has been present for some time.  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  Neurogenic bladder requiring intermittent  catheterization Requires self-catheterization multiple times a day due to neurogenic bladder.  Chronic constipation Chronic constipation managed with lactulose. - Recommend starting Miralax daily for fiber supplementation  History of fall with functional decline Recent fall in the bathroom leading to prolonged immobility. Functional decline with increased difficulty in mobility and fatigue. Home physical therapy recommended to improve strength and mobility. - Coordinate with primary care for home physical therapy referral  Follow-Up Follow-up care to be coordinated with cardiology team in [place]. Echocardiogram results and heart monitor data will guide further management decisions. - Schedule follow-up appointment in [facility] with cardiology team - Ensure echocardiogram and heart monitor data are reviewed before next visit   Follow up: Will be arranged at [facility] office.  Signed,  Gordy Bergamo, MD, [facility] 06/18/2024, 6:27 PM [facility] [address] [place], [place] [address] Phone: [phone]. Fax:  [phone]

## 2024-06-18 NOTE — Telephone Encounter (Signed)
 Spoke with son who is aware of cardioversion date  10-28  @ 12 pm and location in the [facility] heart and vascular center.

## 2024-06-18 NOTE — H&P (View-Only) (Signed)
 Cardiology Office Note:  .   Date:  06/18/2024  ID:  Hector Simmons, DOB 1941/01/25, MRN [id number] PCP: Antonette Angeline ORN, NP  [facility] Providers Cardiologist:  None   History of Present Illness: .   Edenilson Austad is a 83 y.o. Caucasian male patient with hypertension, hypercholesterolemia, stage IV chronic kidney disease, evaluated in the emergency room on 05/31/2024 after he was found on the floor for 14 hours reporting he slipped in the shower and fell.  He had mild rhabdomyolysis and acute renal failure and dehydration and also mild anemia.  EKG revealed new onset atrial fibrillation and was recommended hospital admission but left AMA and was evaluated by Ms. Angeline Antonette, NP and referred for further cardiac evaluation.  He continues to endorse marked generalized weakness and shortness of breath.  He is accompanied by his son, states that since the fall, he has deteriorated significantly with regard to his activities of daily living, marked generalized weakness, dyspnea with minimal activities.  No PND or orthopnea.  Cardiac Studies relevent.       Discussed the use of AI scribe software for clinical note transcription with the patient, who gave verbal consent to proceed.  History of Present Illness Benjy Kana is an 83 year old male with atrial tachycardia who presents for evaluation after a recent fall and irregular heart rhythm. He is accompanied by his son. He was referred by his primary care nurse practitioner, Angeline Barks, for evaluation of his irregular heart rhythm and recent medication changes.  Cardiac arrhythmia and hemodynamic instability - Irregular heart rhythm identified in the emergency room following a fall - History of atrial tachycardia - Amlodipine  and metoprolol  discontinued by primary care nurse practitioner due to low blood pressure - Heart murmur present since childhood  Fall and functional decline - Experienced a fall  in the bathroom and was unable to get up for 14 hours until found by a neighbor - Increased fatigue and reduced stamina, with exhaustion after walking across the bedroom - Difficulty rising quickly, worsened since the fall - Uses a four-legged walker for stability at home - Lives alone  Dyspnea and exercise intolerance - Shortness of breath with minimal exertion  Peripheral edema - Mild leg swelling  Cognitive changes and dehydration - Episode of confusion and dehydration three weeks ago, improved after eating - No history of diabetes - No recent urinary tract infection   Labs   Lab Results  Component Value Date   CHOL 141 12/22/2023   HDL 60 12/22/2023   LDLCALC 65 12/22/2023   TRIG 75 12/22/2023   CHOLHDL 2.4 12/22/2023   No results found for: LIPOA  Recent Labs    12/22/23 0906 01/09/24 0845 06/01/24 2010  NA 137 137 131*  K 5.8* 5.6* 4.3  CL 108 107 100  CO2 21 24 19*  GLUCOSE 113 102 203*  BUN 37* 35* 42*  CREATININE 1.99* 2.26* 2.35*  CALCIUM 9.2 9.1 8.7*  GFRNONAA  --   --  27*    Lab Results  Component Value Date   ALT 66 (H) 06/01/2024   AST 176 (H) 06/01/2024   ALKPHOS 69 06/01/2024   BILITOT 1.4 (H) 06/01/2024      Latest Ref Rng & Units 06/01/2024    8:10 PM 12/22/2023    9:06 AM 06/22/2023   10:27 AM  CBC  WBC 4.0 - 10.5 K/uL 9.7  7.3  7.0   Hemoglobin 13.0 - 17.0 g/dL 11.6  10.1  10.1   Hematocrit 39.0 - 52.0 % 34.5  30.9  31.0   Platelets 150 - 400 K/uL 157  200  200    Lab Results  Component Value Date   HGBA1C 5.7 (H) 12/22/2023    Lab Results  Component Value Date   TSH 1.43 02/29/2024    BNP (last 3 results) Recent Labs    06/13/24 1455  BNP 369*    ROS  Review of Systems  Constitutional: Positive for malaise/fatigue.  Cardiovascular:  Positive for dyspnea on exertion. Negative for chest pain, leg swelling, orthopnea, palpitations and syncope.   Physical Exam:   VS:  BP (!) 136/53 (BP Location: Left Arm, Patient  Position: Sitting, Cuff Size: Normal)   Pulse (!) 48   Ht 6' 1 (1.854 m)   Wt 202 lb (91.6 kg)   SpO2 97%   BMI 26.65 kg/m    Wt Readings from Last 3 Encounters:  06/18/24 202 lb (91.6 kg)  06/13/24 200 lb (90.7 kg)  06/01/24 200 lb (90.7 kg)    BP Readings from Last 3 Encounters:  06/18/24 (!) 136/53  06/02/24 127/84  04/09/24 122/64   Physical Exam Neck:     Vascular: No JVD.  Cardiovascular:     Rate and Rhythm: Regular rhythm. Bradycardia present. Occasional Extrasystoles are present.    Pulses: Intact distal pulses.          Carotid pulses are  on the right side with bruit and  on the left side with bruit.    Heart sounds: S1 normal and S2 normal. Murmur heard.     Early systolic murmur is present with a grade of 2/6 at the upper right sternal border radiating to the neck.     No gallop.  Pulmonary:     Effort: Pulmonary effort is normal.     Breath sounds: Normal breath sounds.  Abdominal:     General: Bowel sounds are normal.     Palpations: Abdomen is soft.  Musculoskeletal:     Right lower leg: Edema (1-2+ ankle edema) present.     Left lower leg: No edema (1-2+ ankle edema).    EKG:    EKG Interpretation Date/Time:  Tuesday June 18 2024 08:56:03 EDT Ventricular Rate:  48 PR Interval:    QRS Duration:  126 QT Interval:  470 QTC Calculation: 419 R Axis:   -71  Text Interpretation: EKG 8 06/18/2024: Underlying atrial tachycardia with Junctional escape at the rate of 50 bpm left anterior fascicular block.  Right bundle branch block.  Compared to 06/06/2024, , Previous atrial tachycardia with ventricular rate of 123 bpm and atrial complexes are much more prominent and appreciated. Reconfirmed by Margarete Horace, Jagadeesh [phone]) on 06/18/2024 6:23:01 PM    ASSESSMENT AND PLAN: .      ICD-10-CM   1. Atrial tachycardia  I47.19 EKG 12-Lead    apixaban (ELIQUIS) 2.5 MG TABS tablet    2. 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 Primary hypertension  I10 EKG  12-Lead    4. Pure hypercholesterolemia  E78.00 EKG 12-Lead      Assessment & Plan Atrial tachycardia with junctional escape rhythm Atrial tachycardia with irregular heart rhythm, likely contributing to fatigue due to increased heart rate in the upper chamber. Potential need for cardioversion to restore normal rhythm. Echocardiogram results pending to assess heart function before proceeding with cardioversion. CPR consent obtained in case of cardiac arrest during the procedure. - Start Eliquis  2.5 mg twice daily for anticoagulation - Order echocardiogram to assess heart function - Schedule cardioversion in three weeks in [place] - Zio patch monitoring has been ordered by PCP, instruct to wear a heart monitor and walk to assess heart rate response to exercise as patient has bradycardia today with junctional escape.  Beta-blocker and amlodipine  has been discontinued recently. - Patient is full code -He has no atrial fibrillation, no atrial flutter, hence does not need long-term anticoagulation.  Depends upon how his heart rate will respond off of beta-blocker therapy that was discontinued recently and also post cardioversion, if fatigue does not improve on restoration of sinus rhythm, he may need EP evaluation and possible pacemaker implantation. - Echocardiogram has been ordered and pending.  As patient lives in [place], will transfer care to our [place] office.  Essential hypertension, controlled off medication Blood pressure is currently normal without medication. Recent medication changes include stopping amlodipine  and metoprolol  due to low blood pressure.  Pure hypercholesterolemia, well controlled on statin therapy Cholesterol levels are well controlled on lovastatin .  Anemia, unspecified etiology Mild anemia with no clear etiology identified. Anemia has been present for some time.  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  Neurogenic bladder requiring intermittent  catheterization Requires self-catheterization multiple times a day due to neurogenic bladder.  Chronic constipation Chronic constipation managed with lactulose. - Recommend starting Miralax daily for fiber supplementation  History of fall with functional decline Recent fall in the bathroom leading to prolonged immobility. Functional decline with increased difficulty in mobility and fatigue. Home physical therapy recommended to improve strength and mobility. - Coordinate with primary care for home physical therapy referral  Follow-Up Follow-up care to be coordinated with cardiology team in [place]. Echocardiogram results and heart monitor data will guide further management decisions. - Schedule follow-up appointment in [facility] with cardiology team - Ensure echocardiogram and heart monitor data are reviewed before next visit   Follow up: Will be arranged at [facility] office.  Signed,  Gordy Bergamo, MD, [facility] 06/18/2024, 6:27 PM [facility] [address] [place], [place] [address] Phone: [phone]. Fax:  [phone]

## 2024-06-18 NOTE — Patient Instructions (Addendum)
 Medication Instructions:   Your physician recommends that you continue on your current medications as directed. Please refer to the Current Medication list given to you today.   *If you need a refill on your cardiac medications before your next appointment, please call your pharmacy*  Lab Work: NONE ORDERED  TODAY    If you have labs (blood work) drawn today and your tests are completely normal, you will receive your results only by: MyChart Message (if you have MyChart) OR A paper copy in the mail If you have any lab test that is abnormal or we need to change your treatment, we will call you to review the results.  Testing/Procedures:  Your physician has recommended that you have a Cardioversion (DCCV). Electrical Cardioversion uses a jolt of electricity to your heart either through paddles or wired patches attached to your chest. This is a controlled, usually prescheduled, procedure. Defibrillation is done under light anesthesia in the hospital, and you usually go home the day of the procedure. This is done to get your heart back into a normal rhythm. You are not awake for the procedure. Please see the instruction sheet given to you today   Follow-Up: At [facility], you and your health needs are our priority.  As part of our continuing mission to provide you with exceptional heart care, our providers are all part of one team.  This team includes your primary Cardiologist (physician) and Advanced Practice Providers or APPs (Physician Assistants and Nurse Practitioners) who all work together to provide you with the care you need, when you need it.  Your next appointment: 2-3 MONTHS POST CARDIOVERSION  IN [place]  APP   We recommend signing up for the patient portal called MyChart.  Sign up information is provided on this After Visit Summary.  MyChart is used to connect with patients for Virtual Visits (Telemedicine).  Patients are able to view lab/test results, encounter  notes, upcoming appointments, etc.  Non-urgent messages can be sent to your provider as well.   To learn more about what you can do with MyChart, go to [email or web address].   Other Instructions    You are scheduled for a Cardioversion on 07-09-2024 with Dr. Lonni . Please arrive at the [facility]  of [facility] at 830 a.m. on the day of your procedure.  DIET: A) Nothing to eat or drink after midnight except your medications with a                  sip of water.  Your labs are already completed.  MAKE SURE YOU TAKE YOUR ELIQUIS .  YOU MAY TAKE ALL of your remaining medications with a small amount of water.   Must have a responsible person to drive you home.  Bring a current list of your medications and current insurance cards.   * Special note: Every effort is made to have your procedure done on time. Occasionally there are emergencies that present themselves at the hospital that may cause delays. Please be patient if a delay does occur.  If you have any questions after you get home, please call the office at [phone].

## 2024-06-18 NOTE — Telephone Encounter (Signed)
 Lvm for son per visit to call for changes about dccv schedule same day 07-09-24  in [place] at 12 noon with Dr. Mady. Left direct number [phone] to call back to make sure aware of location and time.

## 2024-06-19 ENCOUNTER — Telehealth: Payer: Self-pay

## 2024-06-19 ENCOUNTER — Telehealth: Payer: Self-pay | Admitting: Internal Medicine

## 2024-06-19 DIAGNOSIS — G629 Polyneuropathy, unspecified: Secondary | ICD-10-CM | POA: Diagnosis not present

## 2024-06-19 DIAGNOSIS — D631 Anemia in chronic kidney disease: Secondary | ICD-10-CM | POA: Diagnosis not present

## 2024-06-19 DIAGNOSIS — I480 Paroxysmal atrial fibrillation: Secondary | ICD-10-CM | POA: Diagnosis not present

## 2024-06-19 DIAGNOSIS — N529 Male erectile dysfunction, unspecified: Secondary | ICD-10-CM | POA: Diagnosis not present

## 2024-06-19 DIAGNOSIS — K219 Gastro-esophageal reflux disease without esophagitis: Secondary | ICD-10-CM | POA: Diagnosis not present

## 2024-06-19 DIAGNOSIS — N1832 Chronic kidney disease, stage 3b: Secondary | ICD-10-CM | POA: Diagnosis not present

## 2024-06-19 DIAGNOSIS — G8220 Paraplegia, unspecified: Secondary | ICD-10-CM | POA: Diagnosis not present

## 2024-06-19 DIAGNOSIS — E538 Deficiency of other specified B group vitamins: Secondary | ICD-10-CM | POA: Diagnosis not present

## 2024-06-19 DIAGNOSIS — I129 Hypertensive chronic kidney disease with stage 1 through stage 4 chronic kidney disease, or unspecified chronic kidney disease: Secondary | ICD-10-CM | POA: Diagnosis not present

## 2024-06-19 NOTE — Telephone Encounter (Signed)
 Left message for patient to return call OK  to advise  when Corean returns call. Didn't leave verbal order due to not informed if secure VM

## 2024-06-19 NOTE — Telephone Encounter (Signed)
 Ok for verbal orders as requested.

## 2024-06-19 NOTE — Telephone Encounter (Signed)
 Called patient back to speak with him. He wanted to update both myself and Angeline about his son who recently came with him to his appointment. He mentioned that his son is a Alcoholic and he would prefer that we not communicate with him if we didn't need to. Patient is coming in tomorrow for an appointment. I advised him that we could discuss more in depth tomorrow, as well as update any paperwork needed. Patient would also like to discuss how to get a life alert at appointment tomorrow, in case he has another fall since he lives alone. We will assist with this tomorrow at his appointment.

## 2024-06-19 NOTE — Telephone Encounter (Signed)
 Spoke with Tiffany (not stephanie who called in) verbal orders given

## 2024-06-19 NOTE — Telephone Encounter (Signed)
 Copied from CRM #[id number]. Topic: Clinical - Medical Advice >> Jun 19, 2024 10:28 AM Tiffini S wrote: Reason for CRM: Patient called asking to talk with Jeoffrey about a personal question about his health.  Please call the patient back at [phone].

## 2024-06-19 NOTE — Telephone Encounter (Signed)
 Copied from CRM #[id number]. Topic: Clinical - Home Health Verbal Orders >> Jun 19, 2024  3:24 PM Jasmin G wrote: Caller/Agency: Ms. Corean from [facility] Callback Number: [phone] Service Requested: Physical Therapy/Home Health Aid  Frequency: 1 week 1, 2 weeks 4, 1 week 4/ 1 time a week for 8 weeks  Any new concerns about the patient? No

## 2024-06-20 ENCOUNTER — Encounter: Payer: Self-pay | Admitting: Internal Medicine

## 2024-06-20 ENCOUNTER — Ambulatory Visit: Admitting: Internal Medicine

## 2024-06-20 VITALS — BP 140/66 | HR 59 | Ht 73.0 in | Wt 202.0 lb

## 2024-06-20 DIAGNOSIS — N319 Neuromuscular dysfunction of bladder, unspecified: Secondary | ICD-10-CM

## 2024-06-20 DIAGNOSIS — F5101 Primary insomnia: Secondary | ICD-10-CM | POA: Diagnosis not present

## 2024-06-20 DIAGNOSIS — K219 Gastro-esophageal reflux disease without esophagitis: Secondary | ICD-10-CM

## 2024-06-20 DIAGNOSIS — Z23 Encounter for immunization: Secondary | ICD-10-CM

## 2024-06-20 DIAGNOSIS — G629 Polyneuropathy, unspecified: Secondary | ICD-10-CM

## 2024-06-20 DIAGNOSIS — D631 Anemia in chronic kidney disease: Secondary | ICD-10-CM

## 2024-06-20 DIAGNOSIS — I10 Essential (primary) hypertension: Secondary | ICD-10-CM

## 2024-06-20 DIAGNOSIS — K592 Neurogenic bowel, not elsewhere classified: Secondary | ICD-10-CM | POA: Diagnosis not present

## 2024-06-20 DIAGNOSIS — N1832 Chronic kidney disease, stage 3b: Secondary | ICD-10-CM

## 2024-06-20 DIAGNOSIS — I700 Atherosclerosis of aorta: Secondary | ICD-10-CM

## 2024-06-20 DIAGNOSIS — E663 Overweight: Secondary | ICD-10-CM

## 2024-06-20 DIAGNOSIS — N189 Chronic kidney disease, unspecified: Secondary | ICD-10-CM | POA: Diagnosis not present

## 2024-06-20 DIAGNOSIS — N390 Urinary tract infection, site not specified: Secondary | ICD-10-CM

## 2024-06-20 DIAGNOSIS — I4719 Other supraventricular tachycardia: Secondary | ICD-10-CM | POA: Insufficient documentation

## 2024-06-20 DIAGNOSIS — R7303 Prediabetes: Secondary | ICD-10-CM

## 2024-06-20 NOTE — Assessment & Plan Note (Signed)
 Lipid profile reviewed Encouraged to consume a low-fat diet Continue lovastatin  40 mg daily

## 2024-06-20 NOTE — Assessment & Plan Note (Signed)
 amlodipine  5 mg and metoprolol  25 mg twice daily currently on hold Reinforced DASH diet Kidney function reviewed

## 2024-06-20 NOTE — Assessment & Plan Note (Signed)
 Continue lactulose 30 g and MiraLAX 17 g daily Encouraged high-fiber diet and adequate water intake

## 2024-06-20 NOTE — Progress Notes (Signed)
 Subjective:    Subjective Patient ID: Hector Simmons, male    DOB: 07/18/1941, 83 y.o.   MRN: [id number]    HPI  Patient presents to clinic today to establish care and for management of the conditions listed below.  GERD: Triggered by tomato based sauces with pepperoni.  He denies breakthrough on omeprazole .  There is no upper GI on file.  HTN: His BP today is 142/54.  He is not taking amlodipine  or metoprolol  as prescribed.  ECG from 06/2024 reviewed.  Atrial tachycardia: Recent diagnosis.  He was recently started on apixaban.  He is scheduled for a cardioversion.  ECG from 06/2024 reviewed.  He is following with cardiology.  CKD: His last creatinine was 1.62, GFR 42, 06/2024.  He is no longer on lisinopril  for renal protection.  He follows with nephrology.  Anemia of CKD: His last H/H was 11.6/34.5, 05/2024.  He is not taking any oral iron at this time due to constipation.  He does not follow with hematology.  Prediabetes: His last A1c was 5.7%, 12/2023. He is not taking any oral diabetic medication at this time.  He does not check his sugars.   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 He has to self cath. He is taking oxybutynin  and mirilax and lactulose as prescribed.  He follows with urology.  HLD with aortic atherosclerosis: His last LDL was 65, triglycerides 75, 12/2023.  He denies myalgias on lovastatin .  He is taking apixaban as well.  He tries to consume a low-fat diet.  Insomnia: He has difficulty staying asleep.  He is taking trazodone  as prescribed but he does not feel like it is effective.  There is no sleep study on file.  Recurrent UTI: Managed with trimethoprim .  He follows with urology.  Past Medical History:  Diagnosis Date   Acid reflux 09/17/2013   Last Assessment & Plan:  Symptoms managed on ppi, continue current regimen    Actinic keratosis    Anemia of renal disease  04/12/2012   BP (high blood pressure) 09/17/2013   Last Assessment & Plan:  Hypertension is stable. Continue current treatment regimen. Dietary sodium restriction. Regular aerobic exercise. Continue current medications.    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 Continue current treatment regimen.    CN (constipation) 01/22/2014   Last Assessment & Plan:  Start PEG powder every day.  incr water (hard w his situation fo self cathing).     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  Last Assessment & Plan:  Continue current exercise regimen to maximize lower extremity strength.  Discussed PT with patient, but patient reports being able to do exercises on his own as taught.      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  Last Assessment & Plan:  Refer to podiatry one time per year.      Current Outpatient Medications  Medication Sig Dispense Refill   amLODipine  (NORVASC ) 5 MG tablet TAKE 1 TABLET EVERY DAY (Patient not taking: Reported on 06/18/2024) 90 tablet 1   apixaban (ELIQUIS) 2.5 MG TABS tablet Take 1 tablet (2.5 mg total) by mouth 2 (two) times daily. 60 tablet 3   lovastatin  (MEVACOR ) 40  MG tablet TAKE 1 TABLET AT BEDTIME 90 tablet 1   metoprolol  tartrate (LOPRESSOR ) 25 MG tablet Take 1 tablet (25 mg total) by mouth 2 (two) times daily. (Patient not taking: Reported on 06/18/2024) 60 tablet 0   mometasone  (ELOCON ) 0.1 % lotion Apply topically daily. Mix entire bottle into a 1 lbs jar of over the counter CeraVe cream and apply to itchy rash QD-BID PRN. 60 mL 0   omeprazole  (PRILOSEC) 20 MG capsule TAKE 1 CAPSULE EVERY DAY 90 capsule 1   oxybutynin  (DITROPAN ) 5 MG tablet Take 1 tablet (5 mg total)  by mouth 3 (three) times daily. 270 tablet 3   polyethylene glycol (MIRALAX / GLYCOLAX) 17 g packet Take 17 g by mouth daily as needed for moderate constipation.     traZODone  (DESYREL ) 50 MG tablet TAKE 1 TABLET AT BEDTIME AS NEEDED FOR SLEEP 90 tablet 0   trimethoprim  (TRIMPEX ) 100 MG tablet Take 1 tablet (100 mg total) by mouth daily. 90 tablet 3   No current facility-administered medications for this visit.    Allergies  Allergen Reactions   No Known Allergies     Family History  Problem Relation Age of Onset   Stroke Father    Heart disease Father     Social History   Socioeconomic History   Marital status: Divorced    Spouse name: Not on file   Number of children: Not on file   Years of education: Not on file   Highest education level: Not on file  Occupational History   Not on file  Tobacco Use   Smoking status: Former   Smokeless tobacco: Never  Substance and Sexual Activity   Alcohol use: Yes    Alcohol/week: 0.0 standard drinks of alcohol   Drug use: No   Sexual activity: Not on file  Other Topics Concern   Not on file  Social History Narrative   Not on file   Social Drivers of Health   Financial Resource Strain: Low Risk  (12/22/2023)   Overall Financial Resource Strain (CARDIA)    Difficulty of Paying Living Expenses: Not hard at all  Food Insecurity: No Food Insecurity (12/22/2023)   Hunger Vital Sign    Worried About Running Out of Food in the Last Year: Never true    Ran Out of Food in the Last Year: Never true  Transportation Needs: No Transportation Needs (12/22/2023)   PRAPARE - Administrator, Civil Service (Medical): No    Lack of Transportation (Non-Medical): No  Physical Activity: Sufficiently Active (12/22/2023)   Exercise Vital Sign    Days of Exercise per Week: 4 days    Minutes of Exercise per Session: 60 min  Stress: No Stress Concern Present (12/22/2023)   [employer] of Occupational Health - Occupational Stress  Questionnaire    Feeling of Stress : Only a little  Social Connections: Moderately Isolated (12/22/2023)   Social Connection and Isolation Panel    Frequency of Communication with Friends and Family: More than three times a week    Frequency of Social Gatherings with Friends and Family: Twice a week    Attends Religious Services: More than 4 times per year    Active Member of [employer] or Organizations: No    Attends Banker Meetings: Never    Marital Status: Divorced  Catering manager Violence: Not At Risk (12/22/2023)   Humiliation, Afraid, Rape, and Kick questionnaire    Fear of Current or Ex-Partner: No  Emotionally Abused: No    Physically Abused: No    Sexually Abused: No    ROS:  Constitutional: Pt reports fatigue. Denies fever, malaise, headache or abrupt weight changes.  HEENT: Denies eye pain, eye redness, ear pain, ringing in the ears, wax buildup, runny nose, nasal congestion, bloody nose, or sore throat. Respiratory: Pt reports shortness of breath. Denies difficulty breathing, cough or sputum production.   Cardiovascular: Denies chest pain, chest tightness, palpitations or swelling in the hands or feet.  Gastrointestinal: Patient reports chronic constipation.  Denies abdominal pain, bloating, diarrhea or blood in the stool.  GU: Pt reports urinary retention. Denies frequency, urgency, pain with urination, blood in urine, odor or discharge. Musculoskeletal: Patient reports difficulty with gait, generalized weakness.  Denies decrease in range of motion, muscle pain or joint pain and swelling.  Skin: Denies redness, rashes, lesions or ulcercations.  Neurological: Patient reports insomnia, neuropathic pain.  Denies dizziness, difficulty with memory, difficulty with speech or problems with coordination.  Psych: Denies anxiety, depression, SI/HI.  No other specific complaints in a complete review of systems (except as listed in HPI above).  PE:  BP (!) 142/54 (BP  Location: Left Arm, Patient Position: Sitting, Cuff Size: Normal)   Pulse (!) 59   Ht 6' 1 (1.854 m)   Wt 202 lb (91.6 kg)   SpO2 99%   BMI 26.65 kg/m    Wt Readings from Last 3 Encounters:  06/18/24 202 lb (91.6 kg)  06/13/24 200 lb (90.7 kg)  06/01/24 200 lb (90.7 kg)    General: Appears his stated age, overweight, chronically ill-appearing, in NAD. Skin: Senile purpura noted.  No ulcerations. HEENT: Head: normal shape and size; Eyes: sclera white, no icterus, conjunctiva pink, PERRLA and EOMs intact;  Cardiovascular: Bradycardic with normal rhythm. S1,S2 noted.  No murmur, rubs or gallops noted. No JVD or BLE edema. No carotid bruits noted. Pulmonary/Chest: Normal effort and diminished breath sounds. No respiratory distress. No wheezes, rales or ronchi noted.  Abdomen: Soft and nontender. Normal bowel sounds. Musculoskeletal: In wheelchair today.  Ataxic gait with use of cane. Neurological: Alert and oriented.  Psychiatric: Mood and affect normal. Behavior is normal. Judgment and thought content normal.     Assessment and Plan:    RTC in 6 months for your annual exam Angeline Laura, NP

## 2024-06-20 NOTE — Patient Instructions (Signed)

## 2024-06-20 NOTE — Assessment & Plan Note (Signed)
 A1c reviewed Encourage low-carb diet

## 2024-06-20 NOTE — Assessment & Plan Note (Signed)
 Encouraged diet and exercise for weight loss ?

## 2024-06-20 NOTE — Assessment & Plan Note (Signed)
 Lipid profile reviewed Encouraged him to consume a low-fat diet Continue lovastatin  40 mg and apixaban 2.5 mg twice daily

## 2024-06-20 NOTE — Assessment & Plan Note (Signed)
 Try to avoid foods that trigger reflux Encourage weight loss as this can help reduce reflux symptoms Continue omeprazole  20 mg daily

## 2024-06-20 NOTE — Assessment & Plan Note (Signed)
 Kidney function reviewed No longer on lisinopril  for renal protection He will continue to follow with nephrology

## 2024-06-20 NOTE — Assessment & Plan Note (Signed)
 Metoprolol  25 mg twice daily on hold due to bradycardia Continue apixaban 2.5 mg twice daily per cardiology He is scheduled for cardioversion

## 2024-06-20 NOTE — Assessment & Plan Note (Signed)
 Not medicated We will monitor

## 2024-06-20 NOTE — Assessment & Plan Note (Signed)
 Continue trazodone  50 mg at bedtime Will monitor

## 2024-06-20 NOTE — Assessment & Plan Note (Signed)
 Continue oxybutynin  5 mg daily and self cath He will continue to follow with urology

## 2024-06-20 NOTE — Assessment & Plan Note (Signed)
 Continue trimethoprim  100 mg daily per urology

## 2024-06-20 NOTE — Assessment & Plan Note (Signed)
 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

## 2024-06-27 ENCOUNTER — Telehealth: Payer: Self-pay | Admitting: Internal Medicine

## 2024-06-27 DIAGNOSIS — I129 Hypertensive chronic kidney disease with stage 1 through stage 4 chronic kidney disease, or unspecified chronic kidney disease: Secondary | ICD-10-CM | POA: Diagnosis not present

## 2024-06-27 DIAGNOSIS — G8220 Paraplegia, unspecified: Secondary | ICD-10-CM | POA: Diagnosis not present

## 2024-06-27 DIAGNOSIS — I480 Paroxysmal atrial fibrillation: Secondary | ICD-10-CM | POA: Diagnosis not present

## 2024-06-27 DIAGNOSIS — D631 Anemia in chronic kidney disease: Secondary | ICD-10-CM | POA: Diagnosis not present

## 2024-06-27 DIAGNOSIS — E538 Deficiency of other specified B group vitamins: Secondary | ICD-10-CM | POA: Diagnosis not present

## 2024-06-27 DIAGNOSIS — N529 Male erectile dysfunction, unspecified: Secondary | ICD-10-CM | POA: Diagnosis not present

## 2024-06-27 DIAGNOSIS — G629 Polyneuropathy, unspecified: Secondary | ICD-10-CM | POA: Diagnosis not present

## 2024-06-27 DIAGNOSIS — K219 Gastro-esophageal reflux disease without esophagitis: Secondary | ICD-10-CM | POA: Diagnosis not present

## 2024-06-27 DIAGNOSIS — N1832 Chronic kidney disease, stage 3b: Secondary | ICD-10-CM | POA: Diagnosis not present

## 2024-06-27 NOTE — Telephone Encounter (Signed)
 Copied from CRM #[id number]. Topic: Clinical - Medical Advice >> Jun 27, 2024  3:40 PM Amy B wrote: Reason for CRM: Son wants to know if patient needs transportation after his procedure on the 28th or can the patient drive himself and he also wants to know what kind of avenues he needs to proceed to obtain a life alert button.  He requests a call back to discuss [phone]

## 2024-06-28 ENCOUNTER — Telehealth: Payer: Self-pay

## 2024-06-28 DIAGNOSIS — D631 Anemia in chronic kidney disease: Secondary | ICD-10-CM | POA: Diagnosis not present

## 2024-06-28 DIAGNOSIS — I129 Hypertensive chronic kidney disease with stage 1 through stage 4 chronic kidney disease, or unspecified chronic kidney disease: Secondary | ICD-10-CM | POA: Diagnosis not present

## 2024-06-28 DIAGNOSIS — K219 Gastro-esophageal reflux disease without esophagitis: Secondary | ICD-10-CM | POA: Diagnosis not present

## 2024-06-28 DIAGNOSIS — N1832 Chronic kidney disease, stage 3b: Secondary | ICD-10-CM | POA: Diagnosis not present

## 2024-06-28 DIAGNOSIS — E538 Deficiency of other specified B group vitamins: Secondary | ICD-10-CM | POA: Diagnosis not present

## 2024-06-28 DIAGNOSIS — G8220 Paraplegia, unspecified: Secondary | ICD-10-CM | POA: Diagnosis not present

## 2024-06-28 DIAGNOSIS — N529 Male erectile dysfunction, unspecified: Secondary | ICD-10-CM | POA: Diagnosis not present

## 2024-06-28 DIAGNOSIS — I480 Paroxysmal atrial fibrillation: Secondary | ICD-10-CM | POA: Diagnosis not present

## 2024-06-28 NOTE — Telephone Encounter (Signed)
 LM to call back. Okay to provide the information below from [place] if he calls back. Thanks!

## 2024-06-28 NOTE — Telephone Encounter (Signed)
 Spoke with patient's son Dick (OK per DPR). Informed Dick that patient is still scheduled for DCCV on 07/09/24 at 12:00 PM at [facility]  Dick requested it be noted on patient's chart to call him for any scheduling. He states our office can contact patient to schedule, but Dick also needs to be informed as he assists patient with transportation if needed. Added noted to patient's chart.  Rick expressed appreciation for assistance today.

## 2024-06-28 NOTE — Telephone Encounter (Signed)
 Copied from CRM [id number]. Topic: General - Other >> Jun 28, 2024  8:39 AM Charlet HERO wrote: Reason for CRM: Patient son Dick would like to let Angeline know that the Cardio clinic changed his appt date from 10/28 to 11/14 he states that they did not recah out to him to discuss the change, I provided the son with the number to Cardio for further help and informed I would send the message to Angeline also verified he is on the [facility] with notes.

## 2024-06-28 NOTE — Telephone Encounter (Signed)
 Noted

## 2024-06-28 NOTE — Telephone Encounter (Signed)
 Pts son would like to know why the cardioversion was cancelled on 07/09/2024

## 2024-06-28 NOTE — Telephone Encounter (Signed)
 It appears he is having a cardioversion and I would assume that he would need transportation for this procedure but he will need to call cardiology to double check on this.  As far as a life alert button, he needs to look for a medical supply company that provides these and reach out to them to discuss getting a life alert button. He can reach out to life alert directly.  When his father was in the office, I did sign him up for a life alert booklet to be sent to his house so that he could look into this.

## 2024-07-01 ENCOUNTER — Telehealth: Payer: Self-pay

## 2024-07-01 NOTE — Telephone Encounter (Signed)
 Noted

## 2024-07-01 NOTE — Telephone Encounter (Signed)
 Copied from CRM #[id number]. Topic: General - Other >> Jul 01, 2024 10:32 AM Willma SAUNDERS wrote: Reason for CRM: Inocente from [facility] is calling to advise she will be discharging the patient because at the end of the week he will not be homebound.  Inocente can be reached at [phone]

## 2024-07-02 DIAGNOSIS — I480 Paroxysmal atrial fibrillation: Secondary | ICD-10-CM | POA: Diagnosis not present

## 2024-07-04 DIAGNOSIS — N1832 Chronic kidney disease, stage 3b: Secondary | ICD-10-CM | POA: Diagnosis not present

## 2024-07-05 ENCOUNTER — Other Ambulatory Visit: Payer: Self-pay | Admitting: Internal Medicine

## 2024-07-05 ENCOUNTER — Ambulatory Visit: Admitting: Cardiology

## 2024-07-06 NOTE — Telephone Encounter (Signed)
 Requested Prescriptions  Pending Prescriptions Disp Refills   lovastatin  (MEVACOR ) 40 MG tablet [Pharmacy Med Name: LOVASTATIN  40 MG Oral Tablet] 90 tablet 0    Sig: TAKE 1 TABLET AT BEDTIME     Cardiovascular:  Antilipid - Statins 2 Failed - 07/06/2024 10:35 AM      Failed - Cr in normal range and within 360 days    Creat  Date Value Ref Range Status  01/09/2024 2.26 (H) 0.70 - 1.22 mg/dL Final   Creatinine, Ser  Date Value Ref Range Status  06/01/2024 2.35 (H) 0.61 - 1.24 mg/dL Final         Failed - Lipid Panel in normal range within the last 12 months    Cholesterol  Date Value Ref Range Status  12/22/2023 141 <200 mg/dL Final   LDL Cholesterol (Calc)  Date Value Ref Range Status  12/22/2023 65 mg/dL (calc) Final    Comment:    Reference range: <100 . Desirable range <100 mg/dL for primary prevention;   <70 mg/dL for patients with CHD or diabetic patients  with > or = 2 CHD risk factors. SABRA LDL-C is now calculated using the Martin-Hopkins  calculation, which is a validated novel method providing  better accuracy than the Friedewald equation in the  estimation of LDL-C.  Gladis APPLETHWAITE et al. SANDREA. 7986;689(80): 2061-2068  (http://education.Quest[employer].com/faq/FAQ164)    HDL  Date Value Ref Range Status  12/22/2023 60 > OR = 40 mg/dL Final   Triglycerides  Date Value Ref Range Status  12/22/2023 75 <150 mg/dL Final         Passed - Patient is not pregnant      Passed - Valid encounter within last 12 months    Recent Outpatient Visits           2 weeks ago Primary hypertension   [place] [facility] [place], Angeline ORN, NP   3 weeks ago Bradycardia   [place] [facility] [place], Angeline ORN, NP   2 months ago Open wound of left lower leg, initial encounter   [place] [facility] [place], Angeline ORN, NP   4 months ago Paresthesia of both hands   [place] [facility] [place], Angeline ORN, NP   6  months ago Encounter for general adult medical examination with abnormal findings   [place] [place], Angeline ORN, NP       Future Appointments             In 5 months Jackquline Sawyer, MD [facility]   In 7 months MacDiarmid, Glendia, MD [facility]

## 2024-07-08 ENCOUNTER — Ambulatory Visit: Payer: Self-pay

## 2024-07-08 ENCOUNTER — Telehealth: Payer: Self-pay | Admitting: Urology

## 2024-07-08 ENCOUNTER — Telehealth: Payer: Self-pay | Admitting: Cardiology

## 2024-07-08 ENCOUNTER — Telehealth: Payer: Self-pay

## 2024-07-08 ENCOUNTER — Other Ambulatory Visit: Payer: Self-pay | Admitting: Internal Medicine

## 2024-07-08 DIAGNOSIS — R339 Retention of urine, unspecified: Secondary | ICD-10-CM | POA: Diagnosis not present

## 2024-07-08 NOTE — Telephone Encounter (Signed)
 Copied from CRM [id number]. Topic: General - Other >> Jul 08, 2024 11:19 AM Charlet HERO wrote: Reason for CRM: Patient calling to speak with amber he is stating that she was suppose to call informed patient she is not in office today but will call him tomorrow.

## 2024-07-08 NOTE — Telephone Encounter (Signed)
 Per chart, patient has already been directly contacted by the office today regarding UTI and medication he is taking for it.   Summary: urinary discomfort   Reason for Triage: The patient shares that they have experienced urinary discomfort since 07/05/24 including difficulty urinating and cloudy urine. Please contact further when possible

## 2024-07-08 NOTE — Telephone Encounter (Signed)
 Spoke with patient, per [facility] patient was put on Trimethoprim  to take instead of cipro . Spoke with patient about the switch from Cipro  to trimethorpim. He is insists that he can take both medications. Advised patient to call urology per [facility]. He stated the whole damn day has just been wasted and hung up.

## 2024-07-08 NOTE — Telephone Encounter (Signed)
 Left message for patient to return call OK to advise and find out more information   Appointment tomorrow is with cardiology he will need to contact them about fasting.      Also he is on Cipro  for UTI. Find out if symptoms are getting better then they were.

## 2024-07-08 NOTE — Telephone Encounter (Signed)
 I would advise him to take this until completed.  He needs to call cardiology to see if he needs to hold this tomorrow prior to his procedure.

## 2024-07-08 NOTE — Telephone Encounter (Signed)
 Spoke with patient, he will continue Cipro . He understands to call cardiology and see about holding any medication

## 2024-07-08 NOTE — Telephone Encounter (Signed)
 Please contact the patient as soon as possible.  FYI Only or Action Required?: Action required by provider: request for appointment and lab or test result follow-up needed.  Patient was last seen in primary care on 06/20/2024 by Antonette Angeline ORN, NP.  Called Nurse Triage reporting Urinary Frequency (/).  Symptoms began several days ago.  Interventions attempted: Prescription medications: Ciprofloxacin .  Symptoms are: gradually worsening.  Triage Disposition: See PCP Within 2 Weeks  Patient/caregiver understands and will follow disposition?: Yes  Copied from CRM [id number]. Topic: Clinical - Medical Advice >> Jul 08, 2024 11:23 AM Charlet HERO wrote: Reason for CRM: Patient is calling bc he thinks that he has a uti, he is stating that his urine is cloudy and he is not fully releasing his self, he also wants to know if he should fast before his appt tomorrow please call patient back. Reason for Disposition  Urination is difficult to start (i.e., hesitancy) or straining  Answer Assessment - Initial Assessment Questions 1. SYMPTOM: What's the main symptom you're concerned about? (e.g., frequency, incontinence)     Urinary Retention, Frequency  2. ONSET: When did the  symptoms  start?     Friday  3. PAIN: Is there any pain? If Yes, ask: How bad is it? (Scale: 1-10; mild, moderate, severe)     None  4. CAUSE: What do you think is causing the symptoms?     UTI  5. OTHER SYMPTOMS: Do you have any other symptoms? (e.g., blood in urine, fever, flank pain, pain with urination)    Cloudy Urine  Protocols used: Urinary Symptoms-A-AH

## 2024-07-08 NOTE — Telephone Encounter (Signed)
 Pt would like another RX for Cipro  [facility]  He has 3 or 4 left.

## 2024-07-08 NOTE — Telephone Encounter (Signed)
 Refer to other message from today

## 2024-07-08 NOTE — Telephone Encounter (Signed)
 Pt is frustrated that he has not spoken with anyone for procedure instruction yet. He has multiple questions and needs clarity on his mediation instruction. Please advise.

## 2024-07-08 NOTE — Telephone Encounter (Unsigned)
 Copied from CRM [id number]. Topic: Clinical - Medication Refill >> Jul 08, 2024  1:20 PM Hadassah PARAS wrote: Medication: ciprofloxacin  (CIPRO ) 250 MG tablet  Has the patient contacted their pharmacy? No (Agent: If no, request that the patient contact the pharmacy for the refill. If patient does not wish to contact the pharmacy document the reason why and proceed with request.) (Agent: If yes, when and what did the pharmacy advise?)  This is the patient's preferred pharmacy:  [facility] - [place], [place] - [address] [place] [address] Phone: [phone] Fax: [phone]   Is this the correct pharmacy for this prescription? Yes If no, delete pharmacy and type the correct one.   Has the prescription been filled recently? Yes  Is the patient out of the medication? No  Has the patient been seen for an appointment in the last year OR does the patient have an upcoming appointment? Yes  Can we respond through MyChart? No  Agent: Please be advised that Rx refills may take up to 3 business days. We ask that you follow-up with your pharmacy.

## 2024-07-08 NOTE — Telephone Encounter (Signed)
 This message was sent to our preop team. This is not a preop to be addressed by the preop team.   I will forward this to the [facility] triage.

## 2024-07-08 NOTE — Telephone Encounter (Signed)
 Spoke with patient regarding cardioversion scheduled for tomorrow. Patient wanted to know if he could eat before, I stated based on instructions he cannot eat after midnight, he can take all of his morning medications with a sip of water. Patient also wanted to know what entrance to come in, told patient he was to come to the heart and vascular center for this procedure. Patient verbalized understanding.

## 2024-07-09 ENCOUNTER — Other Ambulatory Visit: Payer: Self-pay

## 2024-07-09 ENCOUNTER — Other Ambulatory Visit: Payer: Self-pay | Admitting: Medical

## 2024-07-09 ENCOUNTER — Ambulatory Visit: Admitting: Certified Registered Nurse Anesthetist

## 2024-07-09 ENCOUNTER — Encounter: Payer: Self-pay | Admitting: Cardiovascular Disease

## 2024-07-09 ENCOUNTER — Ambulatory Visit (HOSPITAL_COMMUNITY): Admit: 2024-07-09 | Admitting: Cardiology

## 2024-07-09 ENCOUNTER — Encounter
Admission: RE | Disposition: A | Discharge status: Home / Self Care | Payer: Self-pay | Source: Home / Self Care | Attending: Cardiovascular Disease

## 2024-07-09 ENCOUNTER — Encounter (HOSPITAL_COMMUNITY): Payer: Self-pay

## 2024-07-09 ENCOUNTER — Ambulatory Visit
Admission: RE | Admit: 2024-07-09 | Discharge: 2024-07-09 | Disposition: A | Discharge status: Home / Self Care | Attending: Cardiovascular Disease | Admitting: Cardiovascular Disease

## 2024-07-09 DIAGNOSIS — Z602 Problems related to living alone: Secondary | ICD-10-CM | POA: Diagnosis not present

## 2024-07-09 DIAGNOSIS — Y93E1 Activity, personal bathing and showering: Secondary | ICD-10-CM | POA: Diagnosis not present

## 2024-07-09 DIAGNOSIS — I4719 Other supraventricular tachycardia: Secondary | ICD-10-CM | POA: Insufficient documentation

## 2024-07-09 DIAGNOSIS — E785 Hyperlipidemia, unspecified: Secondary | ICD-10-CM | POA: Diagnosis not present

## 2024-07-09 DIAGNOSIS — I4891 Unspecified atrial fibrillation: Secondary | ICD-10-CM | POA: Diagnosis not present

## 2024-07-09 DIAGNOSIS — K219 Gastro-esophageal reflux disease without esophagitis: Secondary | ICD-10-CM | POA: Diagnosis not present

## 2024-07-09 DIAGNOSIS — N183 Chronic kidney disease, stage 3 unspecified: Secondary | ICD-10-CM | POA: Diagnosis not present

## 2024-07-09 DIAGNOSIS — D649 Anemia, unspecified: Secondary | ICD-10-CM | POA: Diagnosis not present

## 2024-07-09 DIAGNOSIS — E7800 Pure hypercholesterolemia, unspecified: Secondary | ICD-10-CM | POA: Diagnosis not present

## 2024-07-09 DIAGNOSIS — I129 Hypertensive chronic kidney disease with stage 1 through stage 4 chronic kidney disease, or unspecified chronic kidney disease: Secondary | ICD-10-CM | POA: Diagnosis not present

## 2024-07-09 DIAGNOSIS — W182XXA Fall in (into) shower or empty bathtub, initial encounter: Secondary | ICD-10-CM | POA: Diagnosis not present

## 2024-07-09 DIAGNOSIS — I492 Junctional premature depolarization: Secondary | ICD-10-CM | POA: Insufficient documentation

## 2024-07-09 DIAGNOSIS — N184 Chronic kidney disease, stage 4 (severe): Secondary | ICD-10-CM | POA: Insufficient documentation

## 2024-07-09 DIAGNOSIS — R0602 Shortness of breath: Secondary | ICD-10-CM

## 2024-07-09 HISTORY — PX: CARDIOVERSION: SHX1299

## 2024-07-09 SURGERY — CARDIOVERSION (CATH LAB)
Anesthesia: General

## 2024-07-09 SURGERY — CARDIOVERSION
Anesthesia: General

## 2024-07-09 MED ORDER — PROPOFOL 10 MG/ML IV BOLUS
INTRAVENOUS | Status: DC | PRN
  Administered 2024-07-09 (×3): 10 mg via INTRAVENOUS
  Administered 2024-07-09: 40 mg via INTRAVENOUS

## 2024-07-09 MED ORDER — SODIUM CHLORIDE 0.9 % IV SOLN: INTRAVENOUS | Status: DC

## 2024-07-09 NOTE — Anesthesia Procedure Notes (Signed)
 Date/Time: 07/09/2024 12:31 PM  Performed by: Duwayne Craven, CRNAPre-anesthesia Checklist: Patient identified, Emergency Drugs available, Suction available, Patient being monitored and Timeout performed Patient Re-evaluated:Patient Re-evaluated prior to induction Oxygen Delivery Method: Nasal cannula Induction Type: IV induction Placement Confirmation: CO2 detector and positive ETCO2

## 2024-07-09 NOTE — Telephone Encounter (Signed)
 Requested medications are due for refill today.  unsure  Requested medications are on the active medications list.  yes  Last refill. 07/08/2024  Future visit scheduled.   yes  Notes to clinic.  Please review for refill    Requested Prescriptions  Pending Prescriptions Disp Refills   ciprofloxacin  (CIPRO ) 250 MG tablet      Sig: Take 1 tablet (250 mg total) by mouth 2 (two) times daily.     Off-Protocol Failed - 07/09/2024  5:48 PM      Failed - Medication not assigned to a protocol, review manually.      Passed - Valid encounter within last 12 months    Recent Outpatient Visits           2 weeks ago Primary hypertension   [place] [facility] [place], Angeline ORN, NP   3 weeks ago Bradycardia   [place] [facility] [place], NP   3 months ago Open wound of left lower leg, initial encounter   [place] [facility] [place], Angeline ORN, NP   4 months ago Paresthesia of both hands   [place] [facility] [place], Angeline ORN, NP   6 months ago Encounter for general adult medical examination with abnormal findings   [place] [facility] [place], Angeline ORN, NP       Future Appointments             In 2 weeks [place], Cadence H, PA-C [employer] at [place]   In 5 months Jackquline Sawyer, MD [facility]   In 7 months MacDiarmid, Glendia, MD [facility]

## 2024-07-09 NOTE — Anesthesia Preprocedure Evaluation (Signed)
 Anesthesia Evaluation  Patient identified by MRN, date of birth, ID band Patient awake    Reviewed: Allergy & Precautions, H&P , NPO status , Patient's Chart, lab work & pertinent test results, reviewed documented beta blocker date and time   History of Anesthesia Complications Negative for: history of anesthetic complications  Airway Mallampati: I  TM Distance: >3 FB Neck ROM: full    Dental  (+) Dental Advidsory Given, Caps, Teeth Intact   Pulmonary neg shortness of breath, sleep apnea , neg COPD, neg recent URI, former smoker   Pulmonary exam normal breath sounds clear to auscultation       Cardiovascular Exercise Tolerance: Good hypertension, (-) angina (-) Past MI and (-) Cardiac Stents + dysrhythmias Atrial Fibrillation (-) Valvular Problems/Murmurs Rhythm:regular Rate:Normal     Neuro/Psych neg Seizures  Neuromuscular disease  negative psych ROS   GI/Hepatic Neg liver ROS,GERD  ,,  Endo/Other  negative endocrine ROS    Renal/GU Renal disease (stage 3 CKD)  negative genitourinary   Musculoskeletal   Abdominal   Peds  Hematology negative hematology ROS (+)   Anesthesia Other Findings Past Medical History: 09/17/2013: Acid reflux     Comment:  Last Assessment & Plan:  Symptoms managed on ppi,               continue current regimen  No date: Actinic keratosis 04/12/2012: Anemia of renal disease 09/17/2013: BP (high blood pressure)     Comment:  Last Assessment & Plan:  Hypertension is stable.               Continue current treatment regimen. Dietary sodium               restriction. Regular aerobic exercise. Continue current               medications.  10/23/2013: Chronic kidney disease (CKD), stage III (moderate) (HCC)     Comment:  Last Assessment & Plan:  Renal condition is stable.               Continue current treatment regimen.  01/22/2014: CN (constipation)     Comment:  Last Assessment & Plan:   Start PEG powder every day.                incr water (hard w his situation fo self cathing).   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                Last Assessment & Plan:  Continue current exercise               regimen to maximize lower extremity strength.  Discussed               PT with patient, but patient reports being able to do               exercises on his own as taught.    10/23/2013: Peripheral nerve disease     Comment:  Overview:  Secondary to surgical outcome w decreased               sensation b/l feet.  Last Assessment & Plan:  Refer to               podiatry one time  per year.     Reproductive/Obstetrics negative OB ROS                              Anesthesia Physical Anesthesia Plan  ASA: 3  Anesthesia Plan: General   Post-op Pain Management:    Induction: Intravenous  PONV Risk Score and Plan: 2 and Propofol infusion and TIVA  Airway Management Planned: Natural Airway and Nasal Cannula  Additional Equipment:   Intra-op Plan:   Post-operative Plan:   Informed Consent: I have reviewed the patients History and Physical, chart, labs and discussed the procedure including the risks, benefits and alternatives for the proposed anesthesia with the patient or authorized representative who has indicated his/her understanding and acceptance.     Dental Advisory Given  Plan Discussed with: Anesthesiologist, CRNA and Surgeon  Anesthesia Plan Comments:          Anesthesia Quick Evaluation

## 2024-07-09 NOTE — Telephone Encounter (Signed)
 Pt stated he wanted us  to send in Cipro  because he has a UTI, pt was informed that he would need to be seen in order for us  to prescribe an antibiotic.pt states he will be leaving town on Thursday and does not know when he will return. Pt was informed that we do not have availability this week. Pt was advise to call his PCP or go to urgent care. Pt voiced understanding.

## 2024-07-09 NOTE — Transfer of Care (Signed)
 Immediate Anesthesia Transfer of Care Note  Patient: Hector Simmons  Procedure(s) Performed: CARDIOVERSION  Patient Location: PACU  Anesthesia Type:General  Level of Consciousness: awake and alert   Airway & Oxygen Therapy: Patient Spontanous Breathing and Patient connected to nasal cannula oxygen  Post-op Assessment: Report given to RN and Post -op Vital signs reviewed and stable  Post vital signs: Reviewed and stable  Last Vitals:  Vitals Value Taken Time  BP 130/49 07/09/24 12:30  Temp    Pulse 41 07/09/24 12:31  Resp 21 07/09/24 12:31  SpO2 97 % 07/09/24 12:31    Last Pain:  Vitals:   07/09/24 1132  TempSrc: Oral  PainSc: 0-No pain         Complications: No notable events documented.

## 2024-07-09 NOTE — CV Procedure (Signed)
 Cardioversion procedure note For atrial tachycardia  Procedure Details:  Consent: Risks of procedure as well as the alternatives and risks of each were explained to the (patient/caregiver).  Consent for procedure obtained.  Time Out: Verified patient identification, verified procedure, site/side was marked, verified correct patient position, special equipment/implants available, medications/allergies/relevent history reviewed, required imaging and test results available.  Performed  Patient placed on cardiac monitor, pulse oximetry, supplemental oxygen as necessary.   Sedation given: propofol IV, Dr. Dario Pacer pads placed anterior and posterior chest.   Cardioverted 2 time(s).   Cardioverted at  120J, 150J. Synchronized biphasic Converted to sinus bradycardia   Evaluation: Findings: Post procedure EKG shows: Sinus bradycardia mixed with sinus bradycardia with 2-1 AV block Complications: None Patient did tolerate procedure well.  Time Spent Directly with the Patient:  45 minutes   Tim Dhani Imel, M.D., Ph.D.

## 2024-07-10 ENCOUNTER — Telehealth: Payer: Self-pay

## 2024-07-10 ENCOUNTER — Encounter: Payer: Self-pay | Admitting: Cardiovascular Disease

## 2024-07-10 NOTE — Telephone Encounter (Signed)
 Patient advised per Angeline. Son was also present during call and verbalized understanding.

## 2024-07-10 NOTE — Telephone Encounter (Signed)
 Copied from CRM #[id number]. Topic: Clinical - Medication Question >> Jul 10, 2024 12:29 PM Hector Simmons wrote: Reason for CRM: The patient would like to please know the purpose of their prescription for metoprolol  tartrate (LOPRESSOR ) 25 MG tablet [[id number]] when possible

## 2024-07-10 NOTE — Anesthesia Postprocedure Evaluation (Signed)
 Anesthesia Post Note  Patient: Hector Simmons  Procedure(s) Performed: CARDIOVERSION  Patient location during evaluation: Specials Recovery Anesthesia Type: General Level of consciousness: awake and alert Pain management: pain level controlled Vital Signs Assessment: post-procedure vital signs reviewed and stable Respiratory status: spontaneous breathing, nonlabored ventilation, respiratory function stable and patient connected to nasal cannula oxygen Cardiovascular status: blood pressure returned to baseline and stable Postop Assessment: no apparent nausea or vomiting Anesthetic complications: no   No notable events documented.   Last Vitals:  Vitals:   07/09/24 1245 07/09/24 1300  BP: 126/61 (!) 136/53  Pulse: (!) 42 (!) 49  Resp: (!) 26 17  Temp:  (!) 36.1 C  SpO2: 99% 100%    Last Pain:  Vitals:   07/09/24 1300  TempSrc: Temporal  PainSc: 0-No pain                 Prentice Murphy

## 2024-07-13 DIAGNOSIS — I480 Paroxysmal atrial fibrillation: Secondary | ICD-10-CM | POA: Diagnosis not present

## 2024-07-14 NOTE — Interval H&P Note (Signed)
 History and Physical Interval Note:  07/14/2024 10:50 AM  Hector Simmons  has presented today for surgery, with the diagnosis of Cardioversion   Afib.  The various methods of treatment have been discussed with the patient and family. After consideration of risks, benefits and other options for treatment, the patient has consented to  Procedure(s): CARDIOVERSION (N/A) as a surgical intervention.  The patient's history has been reviewed, patient examined, no change in status, stable for surgery.  I have reviewed the patient's chart and labs.  Questions were answered to the patient's satisfaction.     [name]

## 2024-07-14 NOTE — H&P (Signed)

## 2024-07-16 DIAGNOSIS — N184 Chronic kidney disease, stage 4 (severe): Secondary | ICD-10-CM | POA: Diagnosis not present

## 2024-07-16 DIAGNOSIS — N1832 Chronic kidney disease, stage 3b: Secondary | ICD-10-CM | POA: Diagnosis not present

## 2024-07-16 DIAGNOSIS — R809 Proteinuria, unspecified: Secondary | ICD-10-CM | POA: Diagnosis not present

## 2024-07-16 DIAGNOSIS — R8290 Unspecified abnormal findings in urine: Secondary | ICD-10-CM | POA: Diagnosis not present

## 2024-07-16 DIAGNOSIS — D631 Anemia in chronic kidney disease: Secondary | ICD-10-CM | POA: Diagnosis not present

## 2024-07-16 DIAGNOSIS — E875 Hyperkalemia: Secondary | ICD-10-CM | POA: Diagnosis not present

## 2024-07-17 DIAGNOSIS — E875 Hyperkalemia: Secondary | ICD-10-CM | POA: Diagnosis not present

## 2024-07-17 DIAGNOSIS — I129 Hypertensive chronic kidney disease with stage 1 through stage 4 chronic kidney disease, or unspecified chronic kidney disease: Secondary | ICD-10-CM | POA: Diagnosis not present

## 2024-07-17 DIAGNOSIS — N1832 Chronic kidney disease, stage 3b: Secondary | ICD-10-CM | POA: Diagnosis not present

## 2024-07-17 DIAGNOSIS — D631 Anemia in chronic kidney disease: Secondary | ICD-10-CM | POA: Diagnosis not present

## 2024-07-17 DIAGNOSIS — R809 Proteinuria, unspecified: Secondary | ICD-10-CM | POA: Diagnosis not present

## 2024-07-17 NOTE — Progress Notes (Signed)
 [facility] Follow Up Visit   Patient Name: Hector Simmons, male   Patient DOB: 06-10-1941 Date of Service: 07/17/2024  Patient MRN: [id number] Provider Creating Note: Woodward Brought, MD  [phone] Primary Care Physician: Antonette Corrigan, NP-C   [address] [place] [facility] [id number] Additional Physicians/ Providers:   Impression/Recommendations   Hector Simmons is a 83 y.o. male with hypertension, peripheral nerve disease, BPH and depression who presents as a follow up patient for evaluation of chronic kidney disease stage IIIB. Creatinine 1.62, GFR of 42.     Chronic Kidney Disease stage IIIB: with hyperkalemia and proteinuria. With long standing neurogenic bladder. Suspect hyperkalemia was due to lisinopril , trimethoprim  and obstructive uropathy.  - discontinued lisinopril  due to hyperkalemia.  - not a candidate for SGLT-2 inhibitors due to history of urinary tract infections and neurogenic bladder.  - avoid mineralocorticoid receptor antagonists due to hyperkalemia - avoid nonsteroidal anti-inflammatory agents.    Hypertension with chronic kidney disease: 137/46.  - currently off amlodipine  and metoprolol .  - follow up with cardiology.  - home blood pressure monitoring   Anemia with chronic kidney disease: stable. Hemoglobin stable - will continue to monitor.    Hyperlipidemia - Continue lovastatin     Patient Active Problem List  Diagnosis  . Anemia in chronic kidney disease  . 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 Hyperkalemia  . Chronic kidney disease stage 3B (HCC)  . Proteinuria    Orders Placed This Encounter  . PTH, Intact  . Renal Function Panel  . CBC and Differential       Return in about 3 months (around 10/17/2024).  Chief Complaint   Chief Complaint  Patient presents with  . Follow-up    History of Present Illness   Hector Simmons presents for  follow up. Patient presents by himself.   Patient underwent cardioversion by Dr. Gollan on 10/28. He has now been started on apixaban. He is no longer on metoprolol  or amlodipine . Patient states that he has no lower extremity swelling. Denies any palpitations or chest pain. Denies any more falls. He states his blood pressures are at goal.   He does endorse feeling better since the cardioversion. He is ambulating with a cane today.   Patient presented to the Emergency Department on 9/20 for atrial fibrillation and rhabdomyolysis. Patient fell in his shower and was down for over 15 hours before he was found. However he left the hospital AMA.   Patient states he is at home alone. His son calls him nightly.   Patient denies use of nonsteroidal anti-inflammatory agents.   Medications   Current Outpatient Medications:  .  apixaban (ELIQUIS) 2.5 MG tablet, Take 2.5 mg by mouth in the morning and 2.5 mg in the evening., Disp: , Rfl:  .  amLODIPine  (NORVASC ) 5 MG tablet, Take 5 mg by mouth 1 (one) time each day (Patient not taking: Reported on 07/17/2024), Disp: , Rfl:  .  lovastatin  (MEVACOR ) 40 MG tablet, Take 40 mg by mouth at bed time, Disp: , Rfl:  .  metoprolol  tartrate 25 MG tablet, Take 1 tablet (25 mg total) by mouth 2 (two) times daily. (Patient not taking: Reported on 06/12/2024), Disp: , Rfl:  .  mometasone  (ELOCON ) 0.1 % lotion, Apply topically daily. Mix entire bottle into a 1 lbs jar of over the counter CeraVe cream and apply to itchy rash QD-BID PRN., Disp: , Rfl:  .  omeprazole  (PriLOSEC) 20 MG DR capsule, TAKE 1 CAPSULE EVERY DAY, Disp: , Rfl:  .  oxybutynin  (DITROPAN ) 5 MG tablet, Take 1 tablet (5 mg total) by mouth 3 (three) times daily., Disp: , Rfl:  .  traZODone  (DESYREL ) 50 MG tablet, TAKE 1 TABLET AT BEDTIME AS NEEDED FOR SLEEP, Disp: , Rfl:  .  trimethoprim  (TRIMPEX ) 100 MG tablet, Take 100 mg by mouth 1 (one) time each day, Disp: , Rfl:    Allergies Patient has no known  allergies.  History Past Medical History:  Diagnosis Date  . Anemia in chronic kidney disease   . Benign prostatic hyperplasia   . Chronic kidney disease stage 3B (HCC)   . Gastroesophageal reflux disease   . Hyperkalemia   . 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 Major depressive disorder   . Peripheral neuropathy   . Proteinuria 02/06/2024    Past Surgical History:  Procedure Laterality Date  . CERVICAL SPINE SURGERY    . LUMBAR SPINE SURGERY    . TOTAL KNEE ARTHROPLASTY     Family History  Problem Relation Age of Onset  . Heart disease Mother   . Stroke Father   . Heart disease Father    Social History   Tobacco Use  . Smoking status: Never  . Smokeless tobacco: Never  Substance Use Topics  . Alcohol use: Yes    Comment: 2 per day- usually liquor     Physical Exam  Vitals BP (!) 137/46 (BP Location: Right upper arm, Patient Position: Sitting)   Pulse 58   Temp 97.9 F   Wt 193 lb (87.5 kg)   SpO2 99%   BMI 26.18 kg/m   Vitals reviewed. Constitutional: He is oriented to person, place, and time. Vital signs are normal. He appears well-developed, frail and wheelchair bound.  HEENT:  Head: Normocephalic and atraumatic. Mouth/Throat: Oropharynx is clear and moist.  Eyes: Pupils are equal, round, and reactive to light.  Neck: Neck supple.  Cardiovascular:  Normal rate and regular rhythm.           Pulmonary/Chest: Effort normal and breath sounds normal.  Abdominal: Soft.  Neurological: He is alert and oriented to person, place, and time.  Skin: Skin is warm and dry.     Laboratory Studies  Chemistry  Lab Units 06/12/24 1124 03/07/24 1050 02/06/24 1153 02/06/24 1117 01/09/24 0845 12/22/23 0906 06/22/23 1027 05/16/23 0850 10/21/22 1019  SODIUM mmol/L 134* 135 135  --  137 137  --  134* 137  POTASSIUM mmol/L 4.4 4.7 5.3  --  5.6* 5.8*  --  5.2* 5.1*  CHLORIDE mmol/L 105  105 105  --  107 108  --  106 107  CO2 mmol/L 21 23 21   --  24 21  --  25.0 21.0  ANION GAP mmol/L  --   --   --   --   --   --   --  3* 9  MAGNESIUM mg/dL 1.7  --  1.9  --   --   --   --   --   --   CALCIUM mg/dL 8.5* 8.9 9.2  --  9.1 9.2  --  9.7 9.2  PHOSPHORUS mg/dL 3.4 3.5 3.9  --   --   --   --  3.6 4.1  ALK PHOS U/L 101  --  78  --   --  71  --   --   --  PTH pg/mL 36  --  80*  --   --   --   --  47.6 115.7*  URIC ACID mg/dL 7.9  --  7.7  --   --   --   --   --   --   GLUCOSE mg/dL 844* 96 96  --  897 886  --  123 93  ALBUMIN g/dL 3.1* 4.4 4.4  4.5 80 mg/L  --  4.4  --  4.2 4.0  BUN mg/dL 34* 27* 31*  --  35* 37*  --  30* 32*  CREATININE mg/dL 8.37* 8.31* 8.22*  --  2.26* 1.99*  --  1.83* 1.92*  HEMOGLOBIN A1C % of total Hgb  --   --   --   --   --  5.7* 5.6  --   --         No lab exists for component: IRON SATURATION, TRANSSATPER  CBC  Lab Units 06/12/24 1124 02/06/24 1153  WBC AUTO Thousand/uL  --  6.4  HEMOGLOBIN g/dL  --  89.6*  HEMOGLOBIN URINE  NEGATIVE  --   HEMATOCRIT %  --  31.2*  MCV fL  --  100.3*  PLATELETS AUTO Thousand/uL  --  191    Urine  Lab Units 06/12/24 1124 02/06/24 1153 02/06/24 1117  COLOR U  YELLOW  --   --   COLOR UA   --   --  Yellow  CLARITY UA   --   --  Slightly Cloudy  KETONES U MG/DL  NEGATIVE  --   --   KETONES UA   --   --  Negative  PH UA   --   --  6.0  UROBILINOGEN UA   --   --  0.2  PROT/CREAT RATIO UR mg/g creat 0.373*  373* 0.157*  157*  --         No lab exists for component: CYCLOSPORITR     Woodward Brought, MD  [employer], [place]

## 2024-07-22 DIAGNOSIS — Z961 Presence of intraocular lens: Secondary | ICD-10-CM | POA: Diagnosis not present

## 2024-07-22 DIAGNOSIS — H2511 Age-related nuclear cataract, right eye: Secondary | ICD-10-CM | POA: Diagnosis not present

## 2024-07-22 DIAGNOSIS — H26492 Other secondary cataract, left eye: Secondary | ICD-10-CM | POA: Diagnosis not present

## 2024-07-22 DIAGNOSIS — H43823 Vitreomacular adhesion, bilateral: Secondary | ICD-10-CM | POA: Diagnosis not present

## 2024-07-23 ENCOUNTER — Ambulatory Visit: Payer: Self-pay

## 2024-07-23 NOTE — Telephone Encounter (Signed)
 Will discuss at upcoming appointment tomorrow

## 2024-07-23 NOTE — Telephone Encounter (Signed)
 FYI Only or Action Required?: FYI only for provider: appointment scheduled on 11/12.  Patient was last seen in primary care on 06/20/2024 by Antonette Angeline ORN, NP.  Called Nurse Triage reporting Urinary Frequency.  Symptoms began about a month ago.  Symptoms are: gradually worsening.  Triage Disposition: See Physician Within 24 Hours  Patient/caregiver understands and will follow disposition?: Yes      Copied from CRM #[id number]. Topic: Clinical - Red Word Triage >> Jul 23, 2024  1:23 PM Hector Simmons wrote: Red Word that prompted transfer to Nurse Triage:Inability to urinate, cloudy urine. Wants to drop a sample off to see if he has a bladder infection,      Reason for Disposition  Urinating more frequently than usual (i.e., frequency) OR new-onset of the feeling of an urgent need to urinate (i.e., urgency)  Answer Assessment - Initial Assessment Questions 1. SYMPTOM: What's the main symptom you're concerned about? (e.g., frequency, incontinence)     Increased urinary frequency  2. ONSET: When did the symptoms start?     About a month ago  3. PAIN: Is there any pain? If Yes, ask: How bad is it? (Scale: 1-10; mild, moderate, severe)     No pain  4. CAUSE: What do you think is causing the symptoms?     Unsure  5. OTHER SYMPTOMS: Do you have any other symptoms? (e.g., blood in urine, fever, flank pain, pain with urination)     Cloudy urine  Protocols used: Urinary Symptoms-A-AH

## 2024-07-24 ENCOUNTER — Ambulatory Visit (INDEPENDENT_AMBULATORY_CARE_PROVIDER_SITE_OTHER): Admitting: Internal Medicine

## 2024-07-24 ENCOUNTER — Encounter: Payer: Self-pay | Admitting: Internal Medicine

## 2024-07-24 ENCOUNTER — Ambulatory Visit: Attending: Medical | Admitting: Medical

## 2024-07-24 ENCOUNTER — Emergency Department

## 2024-07-24 ENCOUNTER — Emergency Department
Admission: EM | Admit: 2024-07-24 | Discharge: 2024-07-24 | Discharge status: Against Medical Advice | Attending: Emergency Medicine | Admitting: Emergency Medicine

## 2024-07-24 ENCOUNTER — Encounter: Payer: Self-pay | Admitting: Medical

## 2024-07-24 ENCOUNTER — Other Ambulatory Visit: Payer: Self-pay

## 2024-07-24 VITALS — BP 132/58 | Ht 73.0 in | Wt 188.0 lb

## 2024-07-24 VITALS — BP 170/60 | HR 64 | Ht 72.0 in | Wt 186.0 lb

## 2024-07-24 DIAGNOSIS — I480 Paroxysmal atrial fibrillation: Secondary | ICD-10-CM

## 2024-07-24 DIAGNOSIS — R531 Weakness: Secondary | ICD-10-CM | POA: Diagnosis not present

## 2024-07-24 DIAGNOSIS — I447 Left bundle-branch block, unspecified: Secondary | ICD-10-CM | POA: Diagnosis not present

## 2024-07-24 DIAGNOSIS — I4719 Other supraventricular tachycardia: Secondary | ICD-10-CM | POA: Diagnosis not present

## 2024-07-24 DIAGNOSIS — E782 Mixed hyperlipidemia: Secondary | ICD-10-CM

## 2024-07-24 DIAGNOSIS — R7989 Other specified abnormal findings of blood chemistry: Secondary | ICD-10-CM | POA: Diagnosis not present

## 2024-07-24 DIAGNOSIS — Z789 Other specified health status: Secondary | ICD-10-CM

## 2024-07-24 DIAGNOSIS — I491 Atrial premature depolarization: Secondary | ICD-10-CM | POA: Diagnosis not present

## 2024-07-24 DIAGNOSIS — N390 Urinary tract infection, site not specified: Secondary | ICD-10-CM | POA: Insufficient documentation

## 2024-07-24 DIAGNOSIS — R000 Tachycardia, unspecified: Secondary | ICD-10-CM | POA: Diagnosis not present

## 2024-07-24 DIAGNOSIS — I10 Essential (primary) hypertension: Secondary | ICD-10-CM | POA: Diagnosis not present

## 2024-07-24 DIAGNOSIS — Z87440 Personal history of urinary (tract) infections: Secondary | ICD-10-CM | POA: Diagnosis not present

## 2024-07-24 DIAGNOSIS — I454 Nonspecific intraventricular block: Secondary | ICD-10-CM

## 2024-07-24 DIAGNOSIS — I129 Hypertensive chronic kidney disease with stage 1 through stage 4 chronic kidney disease, or unspecified chronic kidney disease: Secondary | ICD-10-CM | POA: Diagnosis not present

## 2024-07-24 DIAGNOSIS — R350 Frequency of micturition: Secondary | ICD-10-CM | POA: Diagnosis not present

## 2024-07-24 DIAGNOSIS — N189 Chronic kidney disease, unspecified: Secondary | ICD-10-CM | POA: Diagnosis not present

## 2024-07-24 DIAGNOSIS — I492 Junctional premature depolarization: Secondary | ICD-10-CM | POA: Diagnosis not present

## 2024-07-24 DIAGNOSIS — Z5329 Procedure and treatment not carried out because of patient's decision for other reasons: Secondary | ICD-10-CM | POA: Diagnosis not present

## 2024-07-24 DIAGNOSIS — N319 Neuromuscular dysfunction of bladder, unspecified: Secondary | ICD-10-CM

## 2024-07-24 DIAGNOSIS — I493 Ventricular premature depolarization: Secondary | ICD-10-CM | POA: Diagnosis not present

## 2024-07-24 DIAGNOSIS — D649 Anemia, unspecified: Secondary | ICD-10-CM | POA: Insufficient documentation

## 2024-07-24 DIAGNOSIS — Z0389 Encounter for observation for other suspected diseases and conditions ruled out: Secondary | ICD-10-CM | POA: Diagnosis not present

## 2024-07-24 DIAGNOSIS — Z9181 History of falling: Secondary | ICD-10-CM

## 2024-07-24 LAB — POCT URINE DIPSTICK
Bilirubin, UA: NEGATIVE
Glucose, UA: NEGATIVE mg/dL
Ketones, POC UA: NEGATIVE mg/dL
Nitrite, UA: NEGATIVE
POC PROTEIN,UA: 100 — AB
Spec Grav, UA: 1.01 (ref 1.010–1.025)
Urobilinogen, UA: 0.2 U/dL
pH, UA: 6 (ref 5.0–8.0)

## 2024-07-24 LAB — CBC WITH DIFFERENTIAL/PLATELET
Abs Immature Granulocytes: 0.04 K/uL (ref 0.00–0.07)
Basophils Absolute: 0 K/uL (ref 0.0–0.1)
Basophils Relative: 0 %
Eosinophils Absolute: 0 K/uL (ref 0.0–0.5)
Eosinophils Relative: 0 %
HCT: 26.7 % — ABNORMAL LOW (ref 39.0–52.0)
Hemoglobin: 8.7 g/dL — ABNORMAL LOW (ref 13.0–17.0)
Immature Granulocytes: 0 %
Lymphocytes Relative: 5 %
Lymphs Abs: 0.5 K/uL — ABNORMAL LOW (ref 0.7–4.0)
MCH: 30.2 pg (ref 26.0–34.0)
MCHC: 32.6 g/dL (ref 30.0–36.0)
MCV: 92.7 fL (ref 80.0–100.0)
Monocytes Absolute: 1 K/uL (ref 0.1–1.0)
Monocytes Relative: 10 %
Neutro Abs: 8.6 K/uL — ABNORMAL HIGH (ref 1.7–7.7)
Neutrophils Relative %: 85 %
Platelets: 227 K/uL (ref 150–400)
RBC: 2.88 MIL/uL — ABNORMAL LOW (ref 4.22–5.81)
RDW: 15.9 % — ABNORMAL HIGH (ref 11.5–15.5)
WBC: 10.2 K/uL (ref 4.0–10.5)
nRBC: 0 % (ref 0.0–0.2)

## 2024-07-24 LAB — URINALYSIS, W/ REFLEX TO CULTURE (INFECTION SUSPECTED)
Bilirubin Urine: NEGATIVE
Glucose, UA: NEGATIVE mg/dL
Hgb urine dipstick: NEGATIVE
Ketones, ur: NEGATIVE mg/dL
Nitrite: NEGATIVE
Protein, ur: 100 mg/dL — AB
Specific Gravity, Urine: 1.013 (ref 1.005–1.030)
WBC, UA: >50 WBC/hpf (ref 0–5)
pH: 5 (ref 5.0–8.0)

## 2024-07-24 LAB — COMPREHENSIVE METABOLIC PANEL WITH GFR
ALT: 32 U/L (ref 0–44)
AST: 32 U/L (ref 15–41)
Albumin: 3.8 g/dL (ref 3.5–5.0)
Alkaline Phosphatase: 94 U/L (ref 38–126)
Anion gap: 14 (ref 5–15)
BUN: 48 mg/dL — ABNORMAL HIGH (ref 8–23)
CO2: 19 mmol/L — ABNORMAL LOW (ref 22–32)
Calcium: 9.1 mg/dL (ref 8.9–10.3)
Chloride: 97 mmol/L — ABNORMAL LOW (ref 98–111)
Creatinine, Ser: 2.27 mg/dL — ABNORMAL HIGH (ref 0.61–1.24)
GFR, Estimated: 28 mL/min — ABNORMAL LOW (ref >60)
Glucose, Bld: 133 mg/dL — ABNORMAL HIGH (ref 70–99)
Potassium: 5.1 mmol/L (ref 3.5–5.1)
Sodium: 130 mmol/L — ABNORMAL LOW (ref 135–145)
Total Bilirubin: 0.5 mg/dL (ref 0.0–1.2)
Total Protein: 6.9 g/dL (ref 6.5–8.1)

## 2024-07-24 LAB — RESP PANEL BY RT-PCR (RSV, FLU A&B, COVID)  RVPGX2
Influenza A by PCR: NEGATIVE
Influenza B by PCR: NEGATIVE
Resp Syncytial Virus by PCR: NEGATIVE
SARS Coronavirus 2 by RT PCR: NEGATIVE

## 2024-07-24 LAB — LACTIC ACID, PLASMA: Lactic Acid, Venous: 1.3 mmol/L (ref 0.5–1.9)

## 2024-07-24 LAB — TROPONIN T, HIGH SENSITIVITY
Troponin T High Sensitivity: 65 ng/L — ABNORMAL HIGH (ref 0–19)
Troponin T High Sensitivity: 69 ng/L — ABNORMAL HIGH (ref 0–19)

## 2024-07-24 MED ORDER — ACETAMINOPHEN 500 MG PO TABS
1000.0000 mg | ORAL_TABLET | Freq: Once | ORAL | Status: AC
  Administered 2024-07-24: 1000 mg via ORAL
  Filled 2024-07-24: qty 2

## 2024-07-24 MED ORDER — LACTATED RINGERS IV BOLUS
1000.0000 mL | Freq: Once | INTRAVENOUS | Status: AC
  Administered 2024-07-24: 1000 mL via INTRAVENOUS

## 2024-07-24 MED ORDER — CIPROFLOXACIN HCL 250 MG PO TABS: 250.0000 mg | ORAL_TABLET | Freq: Two times a day (BID) | ORAL | 0 refills | Status: AC

## 2024-07-24 MED ORDER — SODIUM CHLORIDE 0.9 % IV SOLN
2.0000 g | Freq: Once | INTRAVENOUS | Status: DC
  Filled 2024-07-24: qty 12.5

## 2024-07-24 NOTE — ED Notes (Signed)
 Second trop sent. In and out cath completed and urine sent to lab

## 2024-07-24 NOTE — Progress Notes (Signed)
 Cardiology Office Note   Date:  07/24/2024  ID:  Oluwatomisin, Deman 1941-07-03, MRN [id number] PCP: Antonette Angeline ORN, NP  [facility] Providers Cardiologist:  None    History of Present Illness LAVALLE SKODA is a 83 y.o. male with a h/o HTN, hypercholesterolemia, stage IV CKD disease, ?atrial tachycardia, h/o falls who presents for follow-up of tachyarrhythmia.   He was evaluated in the ER 05/31/2024 after he was found on the floor for 14 hours reporting he slipped and fell in the shower.  He had mild rhabdomyolysis and acute renal failure and dehydration with mild anemia.  EKG showed new onset A-fib and he was recommended hospital admission, but left AMA and was referred to cardiology.  Patient was seen 06/18/2024 by Dr. Ladona.  It was felt he had atrial tachycardia with junctional escape rhythm.  Eliquis was held given no A-fib or a flutter.  Patient was set up for cardioversion.  He underwent successful cardioversion 07/09/2024.  Heart monitor showed SR, average HR 52bpm, 1 run of SVT, frequent supraventricular ectopy and frequent ectoipy 6.8%, no afib and no sustained arrythmias.   Today, the patient is visibly shaking during the visit, which he says is from feeling cold.  He was diagnosed this morning with a UTI by PCP. He denies chest pain, SOB, fever, lower leg edema. SBP is high today, according to PCP amlodipine  and lisinopril  were stopped. He is accompanied by his son, who is unsure which medications he is taking. It appears he is taking Eliquis and metoprolol .  EKG with lots of artifact, may be from rigors, difficult to see p waves. It shows wide QRS, LBBB, PVCs, possible p waves. May be heart block with junctional escape. EKG was reviewed by MD.   Studies Reviewed EKG Interpretation Date/Time:  Wednesday July 24 2024 14:12:42 EST Ventricular Rate:  64 PR Interval:    QRS Duration:  158 QT Interval:  554 QTC Calculation: 571 R Axis:   89  Text  Interpretation: Wide QRS rhythm with occasional Premature ventricular complexes possible high grade heart block with junctional escape Left bundle branch block When compared with ECG of 09-Jul-2024 12:31, PREVIOUS ECG IS PRESENT Confirmed by Franchester, Ashmi Blas ([id number]) on 07/24/2024 4:51:38 PM    Heart monitor 07/2024   HR 38 - 152, average 52 bpm. 1 nonsustained SVT (longest 9 beats). No atrial fibrillation detected. Frequent supraventricular ectopy, 6.0%. Frequent ventricular ectopy, 6.8%. No sustained arrhythmias. No symptom trigger episodes.   Fonda Kitty Cardiac Electrophysiology          Physical Exam VS:  BP (!) 170/60 (BP Location: Left Arm, Patient Position: Sitting, Cuff Size: Normal)   Pulse 64   Ht 6' (1.829 m)   Wt 186 lb (84.4 kg)   SpO2 99%   BMI 25.23 kg/m        Wt Readings from Last 3 Encounters:  07/24/24 186 lb (84.4 kg)  07/24/24 188 lb (85.3 kg)  07/09/24 188 lb (85.3 kg)    GEN: frail elderly male with rigors NECK: No JVD; No carotid bruits CARDIAC: RRR, no murmurs, rubs, gallops RESPIRATORY:  Clear to auscultation without rales, wheezing or rhonchi  ABDOMEN: Soft, non-tender, non-distended EXTREMITIES:  No edema; No deformity   ASSESSMENT AND PLAN  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 Ladona last month for possible Atrial tach. There was question of Afib and he was started on low dose Eliquis and lopressor  25mg  BID. He underwent cardioversion 10/28  with conversion to SB/SR. Heart monitor showed SR/SB, average HR 52bpm, 1 pSVT (longest 9 beats), no Afib, frequent supraventricular ectopy 6% and frequent ectopy 6.8%. EKG today with lots of artifact, may be from rigors, unclear p waves, wide QRS, PVCs, LBBB. Question of high grade heart block with junctional escape rhythm. EKG was reviewed with MD. Also, appears patient is alternating RBBB/LBBB. Patient denies any significant symptoms, other than being cold. There  was uncertainty of whether he was taking Eliquis and metoprolol . Looking at the labs it appears Hgb dropped 11.6>8.6. Scr 1.62>2.41. BNP last month was 369. He was diagnosed with UTI this AM. Given lab abnormalities, EKG changes, and recent UTI, MD advised ER evaluation and he was brought to the ER. Recommend holding Eliquis and BB. He would likely benefit from an EP consult. Patient will be following in [place].  HTN BP is elevated today. amlodipine  and lisinopril  previously held. He was started on metoprolol  25mg  BID. Would hold BB given EKG changes as above.  HLD LDL 65. Continue Lovastatin  40mg  daily.   H/o falls and functional decline Unclear baseline. He lives alone and uses a walker at home. Will continue to monitor.        Dispo: Follow-up in 3 months  Signed, Yoona Ishii VEAR Fishman, PA-C

## 2024-07-24 NOTE — Progress Notes (Signed)
 Subjective:    Subjective Patient ID: Hector Simmons, male    DOB: 1941/04/30, 83 y.o.   MRN: [id number]   Discussed the use of AI scribe software for clinical note transcription with the patient, who gave verbal consent to proceed.  Hector Simmons is an 83 year old male with recurrent urinary tract infections who presents with cloudy urine and increased frequency.  He has been experiencing urinary issues for about a month, characterized by cloudy urine and increased frequency of urination.  He denies urgency, frequency, dysuria or blood in his urine.  He does have to self cath due to history of urinary retention which he reports has increased significantly over the past month.  He has been taking trimethoprim  daily for recurrent urinary tract infections, but feels it is 'too weak'.  He recalls previously being on ciprofloxacin  daily, which he believes was more effective. He sees his urologist every three to six months.     Of note, he also questions whether he can be taken off his apixaban and put back on his amlodipine  and lisinopril .  He was started on metoprolol  and Eliquis after developing atrial tachycardia.  His amlodipine  and lisinopril  was discontinued at that time due to hypotension.  He has undergone cardiac ablation and has a follow-up with cardiology this afternoon.  His BP today is 132/58.  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 Continue current treatment regimen. Dietary sodium restriction. Regular aerobic exercise. Continue current medications.    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 Continue current treatment regimen.     CN (constipation) 01/22/2014   Last Assessment & Plan:  Start PEG powder every day.  incr water (hard w his situation fo self cathing).     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  Last Assessment & Plan:  Continue current exercise regimen to maximize lower extremity strength.  Discussed PT with patient, but patient reports being able to do exercises on his own as taught.      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  Last Assessment & Plan:  Refer to podiatry one time per year.      Current Outpatient Medications  Medication Sig Dispense Refill   apixaban (ELIQUIS) 2.5 MG TABS tablet Take 1 tablet (2.5 mg total) by mouth 2 (two) times daily. 60 tablet 3   Calcium Carb-Cholecalciferol (CALCIUM + VITAMIN D3 PO) Take 1 tablet by mouth daily.     cholecalciferol (VITAMIN D3) 25 MCG (1000 UNIT) tablet Take 1,000 Units by mouth daily.     ciprofloxacin  (CIPRO ) 250 MG tablet Take 250 mg by mouth 2 (two) times daily.     cyanocobalamin (VITAMIN B12) 1000 MCG tablet Take 1,000 mcg by mouth daily.     lactulose (CHRONULAC) 10 GM/15ML solution Take 10 g by mouth daily as needed for moderate constipation.     lovastatin  (MEVACOR ) 40 MG tablet TAKE 1 TABLET AT BEDTIME  90 tablet 0   metoprolol  tartrate (LOPRESSOR ) 25 MG tablet Take 1 tablet (25 mg total) by mouth 2 (two) times daily. (Patient not taking: No sig reported) 60 tablet 0   omeprazole  (PRILOSEC) 20 MG capsule TAKE 1 CAPSULE EVERY DAY (Patient not taking: Reported on 07/09/2024) 90 capsule 1   oxybutynin  (DITROPAN ) 5 MG tablet Take 1 tablet (5 mg total) by mouth 3 (three) times daily. 270 tablet 3   traZODone  (DESYREL ) 50 MG tablet TAKE 1 TABLET AT BEDTIME AS NEEDED FOR SLEEP 90 tablet 0   trimethoprim  (TRIMPEX ) 100 MG tablet  Take 1 tablet (100 mg total) by mouth daily. 90 tablet 3   No current facility-administered medications for this visit.    No Known Allergies   Family History  Problem Relation Age of Onset   Stroke Father    Heart disease Father     Social History   Socioeconomic History   Marital status: Divorced    Spouse name: Not on file   Number of children: Not on file   Years of education: Not on file   Highest education level: Not on file  Occupational History   Not on file  Tobacco Use   Smoking status: Former   Smokeless tobacco: Never  Substance and Sexual Activity   Alcohol use: Not Currently   Drug use: No   Sexual activity: Not on file  Other Topics Concern   Not on file  Social History Narrative   Lives alone. Son [place]   Social Drivers of Health   Financial Resource Strain: Low Risk  (12/22/2023)   Overall Financial Resource Strain (CARDIA)    Difficulty of Paying Living Expenses: Not hard at all  Food Insecurity: No Food Insecurity (12/22/2023)   Hunger Vital Sign    Worried About Running Out of Food in the Last Year: Never true    Ran Out of Food in the Last Year: Never true  Transportation Needs: No Transportation Needs (12/22/2023)   PRAPARE - Administrator, Civil Service (Medical): No    Lack of Transportation (Non-Medical): No  Physical Activity: Sufficiently Active (12/22/2023)   Exercise Vital Sign    Days of Exercise per Week: 4 days    Minutes of Exercise per Session: 60 min  Stress: No Stress Concern Present (12/22/2023)   [employer] of Occupational Health - Occupational Stress Questionnaire    Feeling of Stress : Only a little  Social Connections: Moderately Isolated (12/22/2023)   Social Connection and Isolation Panel    Frequency of Communication with Friends and Family: More than three times a week    Frequency of Social Gatherings with Friends and Family: Twice a week    Attends Religious Services: More than  4 times per year    Active Member of [employer] or Organizations: No    Attends Banker Meetings: Never    Marital Status: Divorced  Catering Manager Violence: Not At Risk (12/22/2023)   Humiliation, Afraid, Rape, and Kick questionnaire    Fear of Current or Ex-Partner: No    Emotionally Abused: No    Physically Abused: No    Sexually Abused: No    ROS:  Constitutional: Denies fever, malaise, headache, fatigue or abrupt weight changes.  Respiratory: Pt reports chronic shortness of breath. Denies difficulty breathing, cough or sputum production.   Cardiovascular: Denies chest pain, chest tightness, palpitations or swelling in the hands or feet.  Gastrointestinal: Patient reports chronic constipation.  Denies abdominal pain, bloating, diarrhea or blood in the stool.  GU: Pt reports urinary frequency and cloudy urine. Denies urgency, pain with urination, blood in urine, odor or discharge. Musculoskeletal: Patient reports difficulty with gait.  Denies decrease in range of motion, muscle pain or joint pain and swelling.  Skin: Denies redness, rashes, lesions or ulcercations.  Neurological: Patient reports insomnia, neuropathic pain.  Denies dizziness, difficulty with memory, difficulty with speech or problems with coordination.    No other specific complaints in a complete review of systems (except as listed in HPI above).  PE:  BP (!) 132/58 (BP Location: Left Arm, Patient Position: Sitting, Cuff Size: Normal)   Ht 6' 1 (1.854 m)   Wt 188 lb (85.3 kg)   BMI 24.80 kg/m     Wt Readings from Last 3 Encounters:  07/09/24 188 lb (85.3 kg)  06/20/24 202 lb (91.6 kg)  06/18/24 202 lb (91.6 kg)    General: Appears his stated age, overweight, well-nourished.  In NAD. HEENT: Head: normal shape and size; Eyes: sclera white, no icterus, conjunctiva pink, PERRLA and EOMs intact;  Cardiovascular: Bradycardic with normal rhythm. S1,S2 noted.  No murmur, rubs or gallops noted.   Pulmonary/Chest: Normal effort and diminished breath sounds. No respiratory distress. No wheezes, rales or ronchi noted.  Abdomen: Soft and nontender. No CVA tenderness noted. Musculoskeletal: Ataxic gait with use of cane. Neurological: Alert and oriented.    Assessment and Plan:  Assessment and Plan    Urinary tract infection with recurrent urinary tract infections and frequency of micturition Recurrent UTIs with cloudy urine and increased frequency. Urinalysis shows moderate leukocytes and hematuria. Ciprofloxacin  preferred over trimethoprim . - Prescribed Ciprofloxacin  250 mg twice daily for 7 days. - Sent urine culture to confirm infection. - Advised discussion with urologist about changing trimethoprim  to ciprofloxacin  for recurrent infections.  Chronic urinary retention Increased catheterization frequency with no identified cause. - Follow up with urologist every three to six months.     Hypertension, atrial tachycardia Status post cardiac ablation, now managed with metoprolol  and apixaban -Advised him that we could not switch Eliquis for amlodipine  as lisinopril  disease medications treat 2 different things - BP normal today only on metoprolol , advised him he would not restart amlodipine  or lisinopril  unless blood pressure increases >130/80 - He has a follow-up with cardiology today and he can discuss Eliquis management at that time   RTC in 5 months for your annual exam Angeline Laura, NP

## 2024-07-24 NOTE — Addendum Note (Signed)
 Addended by: DESIDERIO RUSSELL SAILOR on: 07/24/2024 05:02 PM   Modules accepted: Orders

## 2024-07-24 NOTE — ED Provider Notes (Signed)
 [facility] Provider Note    Event Date/Time   First MD Initiated Contact with Patient 07/24/24 1541     (approximate)   History   Abnormal Labs   HPI  Hector Simmons is a 83 y.o. male with a history of CKD, hypertension, hyperlipidemia, anemia, and recurrent UTIs who presents with generalized weakness and a UTI diagnosed earlier today.  The patient was seen by his primary care provider earlier today and diagnosed with a UTI.  He took 1 dose of an antibiotic after this appointment.  Subsequently he went to cardiology appointment and was told that his heart rate was somewhat erratic and he was having chills, so was sent to the ED for further evaluation.  The patient states that he just feels worn out.  He denies any acute pain.  He denies any focal symptoms.  He has no vomiting or diarrhea.  He has no abdominal pain.  He does report some chronic urinary symptoms but no acute changes today.  He has no chest pain or difficulty breathing.  The patient self catheterizes and has had multiple UTIs in the past.  I reviewed the past medical records.  The patient was seen by cardiology today for follow-up after recent cardioversion last month, and previously seen by internal medicine at which time he was diagnosed with a likely UTI and started on Cipro .  Physical Exam   Triage Vital Signs: ED Triage Vitals  Encounter Vitals Group     BP 07/24/24 1518 (!) 130/104     Girls Systolic BP Percentile --      Girls Diastolic BP Percentile --      Boys Systolic BP Percentile --      Boys Diastolic BP Percentile --      Pulse Rate 07/24/24 1520 60     Resp --      Temp 07/24/24 1517 (!) 103.1 F (39.5 C)     Temp Source 07/24/24 1517 Oral     SpO2 07/24/24 1520 100 %     Weight --      Height --      Head Circumference --      Peak Flow --      Pain Score 07/24/24 1517 0     Pain Loc --      Pain Education --      Exclude from Growth Chart --     Most recent vital  signs: Vitals:   07/24/24 1830 07/24/24 2028  BP: (!) 139/57   Pulse: 71   Resp: 20   Temp:  99.2 F (37.3 C)  SpO2: 99%      General: Alert, somewhat weak appearing, no distress.  CV:  Good peripheral perfusion.  Resp:  Normal effort.  Lungs CTAB. Abd:  Soft with no focal tenderness.  No distention.  Other:  Dry mucous membranes.   ED Results / Procedures / Treatments   Labs (all labs ordered are listed, but only abnormal results are displayed) Labs Reviewed  COMPREHENSIVE METABOLIC PANEL WITH GFR - Abnormal; Notable for the following components:      Result Value   Sodium 130 (*)    Chloride 97 (*)    CO2 19 (*)    Glucose, Bld 133 (*)    BUN 48 (*)    Creatinine, Ser 2.27 (*)    GFR, Estimated 28 (*)    All other components within normal limits  CBC WITH DIFFERENTIAL/PLATELET - Abnormal; Notable for the  following components:   RBC 2.88 (*)    Hemoglobin 8.7 (*)    HCT 26.7 (*)    RDW 15.9 (*)    Neutro Abs 8.6 (*)    Lymphs Abs 0.5 (*)    All other components within normal limits  URINALYSIS, W/ REFLEX TO CULTURE (INFECTION SUSPECTED) - Abnormal; Notable for the following components:   Color, Urine YELLOW (*)    APPearance CLOUDY (*)    Protein, ur 100 (*)    Leukocytes,Ua LARGE (*)    Bacteria, UA RARE (*)    All other components within normal limits  TROPONIN T, HIGH SENSITIVITY - Abnormal; Notable for the following components:   Troponin T High Sensitivity 65 (*)    All other components within normal limits  TROPONIN T, HIGH SENSITIVITY - Abnormal; Notable for the following components:   Troponin T High Sensitivity 69 (*)    All other components within normal limits  RESP PANEL BY RT-PCR (RSV, FLU A&B, COVID)  [id number]  URINE CULTURE  LACTIC ACID, PLASMA     EKG  ED ECG REPORT I, Waylon Cassis, the attending physician, personally viewed and interpreted this ECG.  Date: 07/24/2024 EKG Time: 1535 Rate: 71 Rhythm: normal sinus rhythm QRS  Axis: normal Intervals: Nonspecific IVCD ST/T Wave abnormalities: Nonspecific ST abnormalities Narrative Interpretation: no evidence of acute ischemia   RADIOLOGY  Chest x-ray: I independently viewed and interpreted the images; there is no focal consolidation or edema  PROCEDURES:  Critical Care performed: No  Procedures   MEDICATIONS ORDERED IN ED: Medications  acetaminophen (TYLENOL) tablet 1,000 mg (1,000 mg Oral Given 07/24/24 1525)  lactated ringers bolus 1,000 mL (0 mLs Intravenous Stopped 07/24/24 1732)     IMPRESSION / MDM / ASSESSMENT AND PLAN / ED COURSE  I reviewed the triage vital signs and the nursing notes.  83 year old male with PMH as noted above, diagnosed with a UTI earlier this morning, presents with generalized weakness, chills, and fever.  Differential diagnosis includes, but is not limited to, UTI, pyelonephritis, other acute infection/sepsis, dehydration, electrolyte abnormality, AKI.  We will give fluids, obtain chest x-ray, lab workup, and reassess.  Patient's presentation is most consistent with acute presentation with potential threat to life or bodily function.  The patient is on the cardiac monitor to evaluate for evidence of arrhythmia and/or significant heart rate changes.   ----------------------------------------- 8:12 PM on 07/24/2024 -----------------------------------------  Urinalysis shows findings consistent with UTI.  Lab workup also reveals elevated troponin.  Creatinine is elevated but this is stable.  CBC shows slightly worsening anemia from baseline.  Given the patient's high fever, generalized weakness and the elevated troponin.  I recommended inpatient admission.  However, the patient declines and states he wants to go home.  He also declined IV cephalosporins and states that he just wants to be treated with Cipro .  The patient reports a history of numerous UTIs and just wants to take the oral medications that he was prescribed  earlier today.  He initially became quite angry with me, however I counseled him on the results of the workup and advised him on the reasons that I was concerned about him; we subsequently had a productive conversation.  I specifically advised the patient and his companion who is his POA on the results of the workup including the elevated troponin and the anemia.  I advised that although his vital signs were stable and he had no leukocytosis and a normal lactate, I was concern for possible  systemic infection and early sepsis.  I strongly recommended IV antibiotics and admission.  I discussed risks including sepsis, blood infection, resulting permanent disability and death.  The patient was able to paraphrase these risks back to me and demonstrated appropriate understanding of them.  He has full decision-making capacity at this time.  His POA also agrees.  They did agree to wait for a repeat troponin, with a consideration that he may agreed to stay in the hospital if it was rising.  However the repeat troponin is essentially unchanged.  At this time, the patient is being discharged AGAINST MEDICAL ADVICE.  I gave him strict return precautions and he expressed understanding.He also understands he may return at any time if he changes his mind and wishes to resume care in the hospital.  He will continue his Cipro  outpatient and follow-up with his primary care provider.   FINAL CLINICAL IMPRESSION(S) / ED DIAGNOSES   Final diagnoses:  Urinary tract infection without hematuria, site unspecified  Elevated troponin  Anemia, unspecified type     Rx / DC Orders   ED Discharge Orders     None        Note:  This document was prepared using Dragon voice recognition software and may include unintentional dictation errors.    Jacolyn Pae, MD 07/24/24 [phone]

## 2024-07-24 NOTE — ED Notes (Signed)
 Pt given water at this time per MD's approval

## 2024-07-24 NOTE — ED Notes (Signed)
 C-Collar removed by Dr. Jacolyn

## 2024-07-24 NOTE — Discharge Instructions (Addendum)
 As discussed, your workup is showing a urinary tract infection.  It is also showing that your troponin (which is an enzyme showing strain on the heart) is elevated.  You are also anemic with a hemoglobin of 8.7.  Take the antibiotic that was prescribed and finish the full course.  Return to the ER immediately for new, worsening, or persistent weakness, confusion, chest pain, difficulty breathing, fever, vomiting, if you are unable to tolerate the medication, or any other new or worsening symptoms.  You may also return at any time if you change your mind and wish to resume your care in the hospital.

## 2024-07-24 NOTE — Patient Instructions (Signed)
 Per provider recommendation, patient taken to the emergency room accompanied by his son and another [employer].

## 2024-07-24 NOTE — ED Notes (Signed)
 Called CCMD to place pt on cardiac monitoring per MD order

## 2024-07-24 NOTE — ED Triage Notes (Signed)
 Patient sent over from [facility] for tachycardia and abnormal hgb and electrolytes. Patient denies all complaints. Recently diagnosed with UTI.

## 2024-07-24 NOTE — Patient Instructions (Signed)
 Urinary Tract Infection, Male A urinary tract infection (UTI) is an infection in your urinary tract. The urinary tract is made up of the organs that make, store, and get rid of pee (urine) in your body. These organs include: The kidneys. The ureters. The bladder. The urethra. What are the causes? Most UTIs are caused by germs called bacteria. They may be in or near your genitals. These germs grow and cause swelling in your urinary tract. What increases the risk? You're more likely to get a UTI if: You have a soft tube called a catheter that drains your pee. You can't control when you pee or poop. You have trouble peeing because of: A prostate that's bigger than normal. A kidney stone. A urinary blockage. A nerve condition that affects your bladder. Not getting enough to drink. You're sexually active. You're an older adult. You're also more likely to get a UTI if you have other health problems, such as: Diabetes. A weak immune system. Your immune system is your body's defense system. Sickle cell disease. Injury of the spine. What are the signs or symptoms? Symptoms may include: Needing to pee right away. Peeing small amounts often. Trouble getting the stream started. Pain or burning when you pee. Blood in your pee. Pee that smells bad or odd. Pain in your belly or lower back. You may also: Feel confused. This may be the first symptom in older adults. Vomit. Not feel hungry. Feel tired or easily annoyed. Have a fever or chills. How is this diagnosed? A UTI is diagnosed based on your medical history and an exam. You may also have other tests. These may include: Pee tests. Blood tests. Tests for sexually transmitted infections (STIs). If you've had more than one UTI, you may need to have imaging studies done to find out why you keep getting them. How is this treated? A UTI can be treated by: Taking antibiotics or other medicines. Drinking enough fluid to keep your pee  pale yellow. In rare cases, a UTI can cause a very bad condition called sepsis. Sepsis may be treated in the hospital. Follow these instructions at home: Medicines Take your medicines only as told by your health care provider. If you were given antibiotics, take them as told by your provider. Do not stop taking them even if you start to feel better. General instructions Make sure you: Pee often and fully. Do not hold your pee for a long time. Pee after you have sex. Contact a health care provider if: Your symptoms don't get better after 1-2 days of taking antibiotics. Your symptoms go away and then come back. You have a fever or chills. You vomit or feel like you may vomit. Get help right away if: You have very bad pain in your back or lower belly. You faint. This information is not intended to replace advice given to you by your health care provider. Make sure you discuss any questions you have with your health care provider. Document Revised: 08/09/2023 Document Reviewed: 12/02/2022 Elsevier Patient Education  2025 [employer].

## 2024-07-24 NOTE — ED Notes (Signed)
 Assisted to wheelchair for transport home by family. Mental status at baseline. Respirations nonlabored. Skin color norm/warm/dry/intact. Leaving AMA.

## 2024-07-25 ENCOUNTER — Telehealth: Payer: Self-pay | Admitting: Cardiology

## 2024-07-25 LAB — URINE CULTURE
MICRO NUMBER:: 17225685
SPECIMEN QUALITY:: ADEQUATE

## 2024-07-25 NOTE — Telephone Encounter (Signed)
 LVM to schedule EP referral appt.

## 2024-07-25 NOTE — Telephone Encounter (Signed)
-----   Message from Nurse Russell ORN sent at 07/24/2024  5:02 PM EST ----- Can we please schedule pt for a new EP consult

## 2024-07-26 ENCOUNTER — Ambulatory Visit

## 2024-07-26 ENCOUNTER — Ambulatory Visit: Payer: Self-pay | Admitting: Internal Medicine

## 2024-07-26 LAB — URINE CULTURE: Culture: 50000 — AB

## 2024-07-26 NOTE — Telephone Encounter (Signed)
Pt verbalized understanding of lab results.

## 2024-07-27 NOTE — Progress Notes (Signed)
 ED Antimicrobial Stewardship Positive Culture Follow Up   Hector Simmons is an 83 y.o. male who presented to [facility] on 07/24/2024 with a chief complaint of  Chief Complaint  Patient presents with   Abnormal Labs    Recent Results (from the past 720 hours)  Urine Culture     Status: None   Collection Time: 07/24/24 10:28 AM   Specimen: Urine  Result Value Ref Range Status   MICRO NUMBER: [id number]  Final   SPECIMEN QUALITY: Adequate  Final   Sample Source NOT GIVEN  Final   STATUS: FINAL  Final   Result:   Final    Less than 10,000 CFU/mL of single Gram negative organism isolated. No further testing will be performed. If clinically indicated, recollection using a method to minimize contamination, with prompt transfer to Urine Culture Transport Tube, is recommended.  Urine Culture     Status: Abnormal   Collection Time: 07/24/24  3:13 PM   Specimen: Urine, Random  Result Value Ref Range Status   Specimen Description   Final    URINE, RANDOM Performed at [facility], [address] [place], [place] [address]    Special Requests   Final    NONE Reflexed from [id number] Performed at [facility], [address] [place], [place] [address]    Culture 50,000 COLONIES/mL ESCHERICHIA COLI (A)  Final   Report Status 07/26/2024 FINAL  Final   Organism ID, Bacteria ESCHERICHIA COLI (A)  Final      Susceptibility   Escherichia coli - MIC*    AMPICILLIN >=32 RESISTANT Resistant     CEFAZOLIN (URINE) Value in next row Sensitive      4 SENSITIVEThis is a modified FDA-approved test that has been validated and its performance characteristics determined by the reporting laboratory.  This laboratory is certified under the Clinical Laboratory Improvement Amendments CLIA as qualified to perform high complexity clinical laboratory testing.    CEFEPIME Value in next row Sensitive      4 SENSITIVEThis is a modified FDA-approved test that has been validated and its performance  characteristics determined by the reporting laboratory.  This laboratory is certified under the Clinical Laboratory Improvement Amendments CLIA as qualified to perform high complexity clinical laboratory testing.    ERTAPENEM Value in next row Sensitive      4 SENSITIVEThis is a modified FDA-approved test that has been validated and its performance characteristics determined by the reporting laboratory.  This laboratory is certified under the Clinical Laboratory Improvement Amendments CLIA as qualified to perform high complexity clinical laboratory testing.    CEFTRIAXONE Value in next row Sensitive      4 SENSITIVEThis is a modified FDA-approved test that has been validated and its performance characteristics determined by the reporting laboratory.  This laboratory is certified under the Clinical Laboratory Improvement Amendments CLIA as qualified to perform high complexity clinical laboratory testing.    CIPROFLOXACIN  Value in next row Resistant      4 SENSITIVEThis is a modified FDA-approved test that has been validated and its performance characteristics determined by the reporting laboratory.  This laboratory is certified under the Clinical Laboratory Improvement Amendments CLIA as qualified to perform high complexity clinical laboratory testing.    GENTAMICIN Value in next row Sensitive      4 SENSITIVEThis is a modified FDA-approved test that has been validated and its performance characteristics determined by the reporting laboratory.  This laboratory is certified under the Clinical Laboratory Improvement Amendments CLIA  as qualified to perform high complexity clinical laboratory testing.    NITROFURANTOIN Value in next row Sensitive      4 SENSITIVEThis is a modified FDA-approved test that has been validated and its performance characteristics determined by the reporting laboratory.  This laboratory is certified under the Clinical Laboratory Improvement Amendments CLIA as qualified to perform high  complexity clinical laboratory testing.    TRIMETH /SULFA  Value in next row Resistant      4 SENSITIVEThis is a modified FDA-approved test that has been validated and its performance characteristics determined by the reporting laboratory.  This laboratory is certified under the Clinical Laboratory Improvement Amendments CLIA as qualified to perform high complexity clinical laboratory testing.    AMPICILLIN/SULBACTAM Value in next row Sensitive      4 SENSITIVEThis is a modified FDA-approved test that has been validated and its performance characteristics determined by the reporting laboratory.  This laboratory is certified under the Clinical Laboratory Improvement Amendments CLIA as qualified to perform high complexity clinical laboratory testing.    PIP/TAZO Value in next row Sensitive      <=4 SENSITIVEThis is a modified FDA-approved test that has been validated and its performance characteristics determined by the reporting laboratory.  This laboratory is certified under the Clinical Laboratory Improvement Amendments CLIA as qualified to perform high complexity clinical laboratory testing.    MEROPENEM Value in next row Sensitive      <=4 SENSITIVEThis is a modified FDA-approved test that has been validated and its performance characteristics determined by the reporting laboratory.  This laboratory is certified under the Clinical Laboratory Improvement Amendments CLIA as qualified to perform high complexity clinical laboratory testing.    * 50,000 COLONIES/mL ESCHERICHIA COLI  Resp panel by RT-PCR (RSV, Flu A&B, Covid) Anterior Nasal Swab     Status: None   Collection Time: 07/24/24  4:27 PM   Specimen: Anterior Nasal Swab  Result Value Ref Range Status   SARS Coronavirus 2 by RT PCR NEGATIVE NEGATIVE Final    Comment: (NOTE) SARS-CoV-2 target nucleic acids are NOT DETECTED.  The SARS-CoV-2 RNA is generally detectable in upper respiratory specimens during the acute phase of infection. The  lowest concentration of SARS-CoV-2 viral copies this assay can detect is 138 copies/mL. A negative result does not preclude SARS-Cov-2 infection and should not be used as the sole basis for treatment or other patient management decisions. A negative result may occur with  improper specimen collection/handling, submission of specimen other than nasopharyngeal swab, presence of viral mutation(s) within the areas targeted by this assay, and inadequate number of viral copies(<138 copies/mL). A negative result must be combined with clinical observations, patient history, and epidemiological information. The expected result is Negative.  Fact Sheet for Patients:  bloggercourse.com  Fact Sheet for Healthcare Providers:  seriousbroker.it  This test is no t yet approved or cleared by the [place]  FDA and  has been authorized for detection and/or diagnosis of SARS-CoV-2 by FDA under an Emergency Use Authorization (EUA). This EUA will remain  in effect (meaning this test can be used) for the duration of the COVID-19 declaration under Section 564(b)(1) of the Act, 21 U.S.C.section 360bbb-3(b)(1), unless the authorization is terminated  or revoked sooner.       Influenza A by PCR NEGATIVE NEGATIVE Final   Influenza B by PCR NEGATIVE NEGATIVE Final    Comment: (NOTE) The Xpert Xpress SARS-CoV-2/FLU/RSV plus assay is intended as an aid in the diagnosis of influenza from Nasopharyngeal swab specimens and should  not be used as a sole basis for treatment. Nasal washings and aspirates are unacceptable for Xpert Xpress SARS-CoV-2/FLU/RSV testing.  Fact Sheet for Patients: bloggercourse.com  Fact Sheet for Healthcare Providers: seriousbroker.it  This test is not yet approved or cleared by the [place]  FDA and has been authorized for detection and/or diagnosis of SARS-CoV-2 by FDA under  an Emergency Use Authorization (EUA). This EUA will remain in effect (meaning this test can be used) for the duration of the COVID-19 declaration under Section 564(b)(1) of the Act, 21 U.S.C. section 360bbb-3(b)(1), unless the authorization is terminated or revoked.     Resp Syncytial Virus by PCR NEGATIVE NEGATIVE Final    Comment: (NOTE) Fact Sheet for Patients: bloggercourse.com  Fact Sheet for Healthcare Providers: seriousbroker.it  This test is not yet approved or cleared by the [place]  FDA and has been authorized for detection and/or diagnosis of SARS-CoV-2 by FDA under an Emergency Use Authorization (EUA). This EUA will remain in effect (meaning this test can be used) for the duration of the COVID-19 declaration under Section 564(b)(1) of the Act, 21 U.S.C. section 360bbb-3(b)(1), unless the authorization is terminated or revoked.  Performed at [facility] [place], [address] [place], [place] [address]     [x]  Patient discharged originally without antimicrobial agent and treatment is now indicated  New antibiotic prescription: Keflex  500 mg TID x 7 days  ED Provider: Dr. Floy Leonor JAYSON Viviana ,[facility] 07/27/2024, 3:27 PM

## 2024-08-05 ENCOUNTER — Telehealth: Payer: Self-pay

## 2024-08-05 DIAGNOSIS — R531 Weakness: Secondary | ICD-10-CM

## 2024-08-05 DIAGNOSIS — R296 Repeated falls: Secondary | ICD-10-CM

## 2024-08-05 NOTE — Telephone Encounter (Signed)
 Routine household things, bathing and dressing

## 2024-08-05 NOTE — Telephone Encounter (Signed)
 Left message for patient to return call OK to get more information from patient when he returns call

## 2024-08-05 NOTE — Telephone Encounter (Signed)
 What is he requesting help with?  Bathing, dressing etc.

## 2024-08-05 NOTE — Telephone Encounter (Signed)
 Copied from CRM #[id number]. Topic: General - Other >> Aug 05, 2024 11:08 AM Jasmin G wrote: Reason for CRM: Pt called regarding recent missed call, pt requested from Ms. Aurora to give him a call back at [phone].

## 2024-08-05 NOTE — Telephone Encounter (Signed)
 I can order a home health aide to help with bathing and dressing. They do not cook, clean, grocery shop. He will also have to have either PT/OT or a nurse for wound management. Does he require either of these? Insurance will not cover an aide without the additional service.

## 2024-08-05 NOTE — Telephone Encounter (Signed)
 Copied from CRM #[id number]. Topic: Clinical - Medical Advice >> Aug 05, 2024  9:20 AM Gustabo D wrote: Pt want a call back from [facility] or [facility]- ASAP pt is trying to get help in the home.

## 2024-08-05 NOTE — Telephone Encounter (Signed)
 Refer to previous message from today

## 2024-08-06 NOTE — Telephone Encounter (Signed)
 Referral to home health placed.

## 2024-08-06 NOTE — Addendum Note (Signed)
 Addended by: ANTONETTE ANGELINE ORN on: 08/06/2024 09:41 AM   Modules accepted: Orders

## 2024-08-07 DIAGNOSIS — R339 Retention of urine, unspecified: Secondary | ICD-10-CM | POA: Diagnosis not present

## 2024-08-12 ENCOUNTER — Telehealth: Payer: Self-pay | Admitting: Internal Medicine

## 2024-08-12 NOTE — Telephone Encounter (Unsigned)
 Copied from CRM #[id number]. Topic: Clinical - Refused Triage >> Aug 12, 2024  1:28 PM Avram MATSU wrote: Patient/caller voiced complaints of patient has a uti and would like a callback for UTI please advise, [phone] ([phone] . Declined transfer to triage.   ----------------------------------------------------------------------- From previous Reason for Contact - Call Back - No Documentation: Reason for CRM:

## 2024-08-13 ENCOUNTER — Other Ambulatory Visit: Payer: Self-pay | Admitting: Internal Medicine

## 2024-08-13 ENCOUNTER — Other Ambulatory Visit: Payer: Self-pay

## 2024-08-13 NOTE — Telephone Encounter (Signed)
 Copied from CRM #[id number]. Topic: Clinical - Medication Refill >> Aug 13, 2024  3:04 PM Carla L wrote: Medication: metoprolol  tartrate (LOPRESSOR ) 25 MG tablet  Has the patient contacted their pharmacy? Yes Pharmacy calling on behalf of patient.   This is the patient's preferred pharmacy:  [facility] - [place], [place] - [address] [place] [address] Phone: [phone] Fax: [phone]  Is this the correct pharmacy for this prescription? Yes  Has the prescription been filled recently? No  Is the patient out of the medication? unsure  Has the patient been seen for an appointment in the last year OR does the patient have an upcoming appointment? Yes  Can we respond through MyChart? Unsure  Agent: Please be advised that Rx refills may take up to 3 business days. We ask that you follow-up with your pharmacy.

## 2024-08-16 ENCOUNTER — Encounter: Payer: Self-pay | Admitting: Internal Medicine

## 2024-08-16 ENCOUNTER — Ambulatory Visit: Admitting: Internal Medicine

## 2024-08-16 VITALS — BP 134/64 | Ht 72.0 in | Wt 187.2 lb

## 2024-08-16 DIAGNOSIS — R300 Dysuria: Secondary | ICD-10-CM

## 2024-08-16 DIAGNOSIS — Z789 Other specified health status: Secondary | ICD-10-CM

## 2024-08-16 DIAGNOSIS — N319 Neuromuscular dysfunction of bladder, unspecified: Secondary | ICD-10-CM | POA: Diagnosis not present

## 2024-08-16 DIAGNOSIS — N390 Urinary tract infection, site not specified: Secondary | ICD-10-CM | POA: Diagnosis not present

## 2024-08-16 LAB — POCT URINE DIPSTICK
Bilirubin, UA: NEGATIVE
Glucose, UA: NEGATIVE mg/dL
Ketones, POC UA: NEGATIVE mg/dL
Nitrite, UA: NEGATIVE
POC PROTEIN,UA: 100 — AB
Spec Grav, UA: 1.015 (ref 1.010–1.025)
Urobilinogen, UA: 0.2 U/dL
pH, UA: 6 (ref 5.0–8.0)

## 2024-08-16 MED ORDER — NITROFURANTOIN MONOHYD MACRO 100 MG PO CAPS: 100.0000 mg | ORAL_CAPSULE | Freq: Two times a day (BID) | ORAL | 0 refills | Status: DC

## 2024-08-16 NOTE — Telephone Encounter (Signed)
 Requested medications are due for refill today.  yes  Requested medications are on the active medications list.  yes  Last refill. 06/01/2024 #60 0 rf  Future visit scheduled.   no  Notes to clinic.  Rx signed by Dr. Clarine    Requested Prescriptions  Pending Prescriptions Disp Refills   metoprolol  tartrate (LOPRESSOR ) 25 MG tablet 60 tablet 0    Sig: Take 1 tablet (25 mg total) by mouth 2 (two) times daily.     Cardiovascular:  Beta Blockers Passed - 08/16/2024 12:53 PM      Passed - Last BP in normal range    BP Readings from Last 1 Encounters:  07/24/24 (!) 139/57         Passed - Last Heart Rate in normal range    Pulse Readings from Last 1 Encounters:  07/24/24 71         Passed - Valid encounter within last 6 months    Recent Outpatient Visits           3 weeks ago Self-catheterizes urinary bladder   [place] [facility] [place], Angeline ORN, NP   1 month ago Primary hypertension   [facility] [place], Angeline ORN, NP   2 months ago Bradycardia   [place] [facility] [place], Angeline ORN, NP   4 months ago Open wound of left lower leg, initial encounter   [facility] [place], Angeline ORN, NP   5 months ago Paresthesia of both hands   [place] [facility], Angeline ORN, NP       Future Appointments             In 3 months Jackquline Sawyer, MD [facility]   In 6 months MacDiarmid, Glendia, MD [facility]

## 2024-08-16 NOTE — Progress Notes (Signed)
 Subjective:    Subjective Patient ID: Hector Simmons, male    DOB: [birth year], 83 y.o.   MRN: [id number]   Discussed the use of AI scribe software for clinical note transcription with the patient, who gave verbal consent to proceed.  Hector Simmons is an 83 year old male with recurrent urinary tract infections who presents with cloudy urine and increased frequency.  He has been experiencing urinary issues for 5 days, characterized by cloudy urine and increased hesitancy of urination.  He denies urgency, frequency, dysuria or blood in his urine.  He does have to self cath due to history of urinary retention for neurogenic bladder which he reports has increased significantly over the past 5 days.  He has been taking trimethoprim  daily for recurrent urinary tract infections, but feels it is 'too weak'.  He recalls previously being on ciprofloxacin  daily, which he believes was more effective. He does not have a follow-up appointment with his urologist until June.   His last UTI was 3 weeks ago grew out E. coli.  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 Continue current treatment regimen. Dietary sodium restriction. Regular aerobic exercise. Continue current medications.    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 Continue current treatment regimen.    CN (constipation) 01/22/2014   Last Assessment & Plan:  Start PEG powder every day.  incr water (hard w his situation fo self cathing).     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  Last Assessment & Plan:  Continue current exercise regimen to maximize lower extremity strength.  Discussed PT with patient, but patient reports being able to do exercises on his own as taught.      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  Last Assessment & Plan:  Refer to podiatry one time per year.      Current Outpatient Medications  Medication Sig Dispense Refill   apixaban  (ELIQUIS ) 2.5 MG TABS tablet Take 1 tablet (2.5 mg total) by mouth 2 (two) times daily. 60 tablet 3   Calcium Carb-Cholecalciferol (CALCIUM + VITAMIN D3 PO) Take 1 tablet by mouth daily.     cholecalciferol (VITAMIN D3) 25 MCG (1000 UNIT) tablet Take 1,000 Units by mouth daily. (Patient not taking: Reported on 07/24/2024)     cyanocobalamin  (VITAMIN B12) 1000 MCG tablet Take 1,000 mcg by mouth daily.     lactulose (CHRONULAC) 10 GM/15ML solution Take 10 g by mouth daily as needed for moderate constipation.     lovastatin  (MEVACOR ) 40 MG tablet TAKE 1 TABLET AT BEDTIME 90 tablet 0   metoprolol  tartrate (LOPRESSOR ) 25 MG tablet Take 1 tablet (25 mg total) by mouth 2 (two) times daily. (Patient not taking: Reported on 07/24/2024) 60 tablet 0   omeprazole  (PRILOSEC) 20 MG capsule TAKE 1 CAPSULE EVERY DAY (Patient not taking: Reported on 07/24/2024) 90 capsule 1   oxybutynin  (DITROPAN ) 5 MG tablet Take 1 tablet (  5 mg total) by mouth 3 (three) times daily. 270 tablet 3   traZODone  (DESYREL ) 50 MG tablet TAKE 1 TABLET AT BEDTIME AS NEEDED FOR SLEEP 90 tablet 0   trimethoprim  (TRIMPEX ) 100 MG tablet Take 1 tablet (100 mg total) by mouth daily. 90 tablet 3   No current facility-administered medications for this visit.    No Known Allergies   Family History  Problem Relation Age of Onset   Stroke Father    Heart disease Father     Social History   Socioeconomic History   Marital status: Divorced     Spouse name: Not on file   Number of children: Not on file   Years of education: Not on file   Highest education level: Not on file  Occupational History   Not on file  Tobacco Use   Smoking status: Former   Smokeless tobacco: Never  Substance and Sexual Activity   Alcohol use: Not Currently   Drug use: No   Sexual activity: Not on file  Other Topics Concern   Not on file  Social History Narrative   Lives alone. Son lives in town: [place]   Social Drivers of Health   Financial Resource Strain: Low Risk  (12/22/2023)   Overall Financial Resource Strain (CARDIA)    Difficulty of Paying Living Expenses: Not hard at all  Food Insecurity: No Food Insecurity (12/22/2023)   Hunger Vital Sign    Worried About Running Out of Food in the Last Year: Never true    Ran Out of Food in the Last Year: Never true  Transportation Needs: No Transportation Needs (12/22/2023)   PRAPARE - Administrator, Civil Service (Medical): No    Lack of Transportation (Non-Medical): No  Physical Activity: Sufficiently Active (12/22/2023)   Exercise Vital Sign    Days of Exercise per Week: 4 days    Minutes of Exercise per Session: 60 min  Stress: No Stress Concern Present (12/22/2023)   [employer] of Occupational Health - Occupational Stress Questionnaire    Feeling of Stress : Only a little  Social Connections: Moderately Isolated (12/22/2023)   Social Connection and Isolation Panel    Frequency of Communication with Friends and Family: More than three times a week    Frequency of Social Gatherings with Friends and Family: Twice a week    Attends Religious Services: More than 4 times per year    Active Member of [employer] or Organizations: No    Attends Banker Meetings: Never    Marital Status: Divorced  Catering Manager Violence: Not At Risk (12/22/2023)   Humiliation, Afraid, Rape, and Kick questionnaire    Fear of Current or Ex-Partner: No    Emotionally Abused: No     Physically Abused: No    Sexually Abused: No    ROS:  Constitutional: Denies fever, malaise, headache, fatigue or abrupt weight changes.  Respiratory: Pt reports chronic shortness of breath. Denies difficulty breathing, cough or sputum production.   Cardiovascular: Denies chest pain, chest tightness, palpitations or swelling in the hands or feet.  Gastrointestinal: Patient reports chronic constipation.  Denies abdominal pain, bloating, diarrhea or blood in the stool.  GU: Pt reports urinary hesitancy and cloudy urine. Denies urgency, pain with urination, blood in urine, odor or discharge. Musculoskeletal: Patient reports difficulty with gait.  Denies decrease in range of motion, muscle pain or joint pain and swelling.  Skin: Denies redness, rashes, lesions or ulcercations.  Neurological: Patient reports  insomnia, neuropathic pain.  Denies dizziness, difficulty with memory, difficulty with speech or problems with coordination.    No other specific complaints in a complete review of systems (except as listed in HPI above).  PE: BP 134/64 (BP Location: Left Arm, Patient Position: Sitting, Cuff Size: Normal)   Ht 6' (1.829 m)   Wt 187 lb 3.2 oz (84.9 kg)   BMI 25.39 kg/m     Wt Readings from Last 3 Encounters:  07/24/24 186 lb (84.4 kg)  07/24/24 188 lb (85.3 kg)  07/09/24 188 lb (85.3 kg)    General: Appears his stated age, overweight, chronically ill-appearing, in NAD. HEENT: Head: normal shape and size; Eyes: sclera white, no icterus, conjunctiva pink, PERRLA and EOMs intact;  Cardiovascular: Bradycardic with normal rhythm. S1,S2 noted.  No murmur, rubs or gallops noted.  Pulmonary/Chest: Normal effort and diminished breath sounds. No respiratory distress. No wheezes, rales or ronchi noted.  Abdomen: Soft and nontender. No CVA tenderness noted. Musculoskeletal: Ataxic gait with use of cane. Neurological: Alert and oriented.    Assessment and Plan:  Assessment and Plan     Urinary tract infection with recurrent urinary tract infections and frequency of micturition Recurrent UTIs with cloudy urine and increased frequency. Urinalysis shows moderate leukocytes and hematuria.  - Prescribed Macrobid  100 mg mg twice daily for 7 days. - Sent urine culture to confirm infection. -Advised him to stop the trimethoprim  while taking Macrobid  - Advised discussion with urologist about changing trimethoprim  to ciprofloxacin  for recurrent infections.  Chronic urinary retention, neurogenic bladder Increased catheterization frequency with no identified cause. - Call urology to see if you can be seen sooner than June  RTC in 3 months for your annual exam Angeline Laura, NP

## 2024-08-16 NOTE — Patient Instructions (Signed)
 Urinary Tract Infection, Male A urinary tract infection (UTI) is an infection in your urinary tract. The urinary tract is made up of the organs that make, store, and get rid of pee (urine) in your body. These organs include: The kidneys. The ureters. The bladder. The urethra. What are the causes? Most UTIs are caused by germs called bacteria. They may be in or near your genitals. These germs grow and cause swelling in your urinary tract. What increases the risk? You're more likely to get a UTI if: You have a soft tube called a catheter that drains your pee. You can't control when you pee or poop. You have trouble peeing because of: A prostate that's bigger than normal. A kidney stone. A urinary blockage. A nerve condition that affects your bladder. Not getting enough to drink. You're sexually active. You're an older adult. You're also more likely to get a UTI if you have other health problems, such as: Diabetes. A weak immune system. Your immune system is your body's defense system. Sickle cell disease. Injury of the spine. What are the signs or symptoms? Symptoms may include: Needing to pee right away. Peeing small amounts often. Trouble getting the stream started. Pain or burning when you pee. Blood in your pee. Pee that smells bad or odd. Pain in your belly or lower back. You may also: Feel confused. This may be the first symptom in older adults. Vomit. Not feel hungry. Feel tired or easily annoyed. Have a fever or chills. How is this diagnosed? A UTI is diagnosed based on your medical history and an exam. You may also have other tests. These may include: Pee tests. Blood tests. Tests for sexually transmitted infections (STIs). If you've had more than one UTI, you may need to have imaging studies done to find out why you keep getting them. How is this treated? A UTI can be treated by: Taking antibiotics or other medicines. Drinking enough fluid to keep your pee  pale yellow. In rare cases, a UTI can cause a very bad condition called sepsis. Sepsis may be treated in the hospital. Follow these instructions at home: Medicines Take your medicines only as told by your health care provider. If you were given antibiotics, take them as told by your provider. Do not stop taking them even if you start to feel better. General instructions Make sure you: Pee often and fully. Do not hold your pee for a long time. Pee after you have sex. Contact a health care provider if: Your symptoms don't get better after 1-2 days of taking antibiotics. Your symptoms go away and then come back. You have a fever or chills. You vomit or feel like you may vomit. Get help right away if: You have very bad pain in your back or lower belly. You faint. This information is not intended to replace advice given to you by your health care provider. Make sure you discuss any questions you have with your health care provider. Document Revised: 08/09/2023 Document Reviewed: 12/02/2022 Elsevier Patient Education  2025 [employer].

## 2024-08-18 LAB — URINE CULTURE
MICRO NUMBER:: 17318844
SPECIMEN QUALITY:: ADEQUATE

## 2024-08-22 ENCOUNTER — Telehealth: Payer: Self-pay

## 2024-08-22 NOTE — Telephone Encounter (Signed)
Spoke to patient he will call cardiology.

## 2024-08-22 NOTE — Telephone Encounter (Signed)
 He needs to call cardiology to discuss his Eliquis .  He can continue it until then

## 2024-08-22 NOTE — Telephone Encounter (Signed)
 Copied from CRM [id number]. Topic: Clinical - Medication Question >> Aug 22, 2024 10:04 AM Hector Simmons wrote: Reason for CRM: Pt is calling to notify PCP that medication for UTI has done the job. Also, should pt stop taking eliquist as he is bleeding for any small bumps. Please advise pt on #[phone]

## 2024-08-25 NOTE — Progress Notes (Unsigned)
 08/29/2024 6:40 PM   Hector Simmons [birth year] [id number]  Referring provider: Antonette Angeline ORN, NP [address] [place],  [place] [address]  Urological history: 1. Neurogenic bladder -self cath's seven times daily -RUS (02/2024) mild right hydro that resolves on post void images  2. Bilateral renal cysts -RUS (02/2024) bilateral cysts  3. Bladder diverticulum -RUS 02/2021 Suspect small diverticulum arising from the leftward aspect of the urinary bladder  CC: rUTI's   HPI: Hector Simmons is a 83 y.o. man who presents today for rUTI's.    Previous records reviewed.  He has been having cloudy urine and an increase need to cath more frequently.  He has had urine cultures positive for E.coli x 2.  Other cultures  grew out MUF.    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 Continue current treatment regimen. Dietary sodium restriction. Regular aerobic exercise. Continue current medications.    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 Continue current treatment regimen.    CN (constipation) 01/22/2014   Last Assessment & Plan:  Start PEG powder every day.  incr water (hard w his situation fo self cathing).     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  Last Assessment & Plan:  Continue current exercise regimen to maximize lower extremity strength.  Discussed PT with patient, but patient reports being able to do exercises on his own as taught.       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  Last Assessment & Plan:  Refer to podiatry one time per year.      Surgical History: Past Surgical History:  Procedure Laterality Date   BACK SURGERY     CARDIOVERSION N/A 07/09/2024   Procedure: CARDIOVERSION;  Surgeon: Perla Evalene PARAS, MD;  Location: [facility] ORS;  Service: Cardiovascular;  Laterality: N/A;   REPLACEMENT TOTAL KNEE Bilateral     Home Medications:  Allergies as of 08/29/2024   No Known Allergies      Medication List        Accurate as of August 25, 2024  6:40 PM. If you have any questions, ask your nurse or doctor.          apixaban  2.5 MG Tabs tablet Commonly known as: Eliquis  Take 1 tablet (2.5 mg total) by mouth 2 (two) times daily.   CALCIUM + VITAMIN D3 PO Take 1 tablet by mouth daily.   cholecalciferol 25 MCG (1000 UNIT) tablet Commonly known as: VITAMIN D3 Take 1,000 Units by mouth daily.   cyanocobalamin  1000 MCG tablet Commonly known as: VITAMIN B12 Take 1,000 mcg by mouth daily.   lactulose 10 GM/15ML solution Commonly known as: CHRONULAC Take 10 g by mouth daily as needed for moderate constipation.   lovastatin  40 MG tablet Commonly known as: MEVACOR  TAKE 1 TABLET AT BEDTIME   metoprolol  tartrate 25 MG tablet Commonly known as: LOPRESSOR  Take 1  tablet (25 mg total) by mouth 2 (two) times daily.   nitrofurantoin  (macrocrystal-monohydrate) 100 MG capsule Commonly known as: MACROBID  Take 1 capsule (100 mg total) by mouth 2 (two) times daily.   omeprazole  20 MG capsule Commonly known as: PRILOSEC TAKE 1 CAPSULE EVERY DAY   oxybutynin  5 MG tablet Commonly known as: DITROPAN  Take 1 tablet (5 mg total) by mouth 3 (three) times daily.   traZODone  50 MG tablet Commonly known as: DESYREL  TAKE 1 TABLET AT BEDTIME AS NEEDED FOR SLEEP   trimethoprim  100 MG tablet Commonly known as: TRIMPEX  Take 1 tablet (100 mg total)  by mouth daily.        Allergies: Allergies[1]  Family History: Family History  Problem Relation Age of Onset   Stroke Father    Heart disease Father     Social History:  reports that he has quit smoking. He has never used smokeless tobacco. He reports that he does not currently use alcohol. He reports that he does not use drugs.  ROS: Pertinent ROS in HPI  Physical Exam: There were no vitals taken for this visit.  Constitutional:  Well nourished. Alert and oriented, No acute distress. HEENT: [place] AT, moist mucus membranes.  Trachea midline, no masses. Cardiovascular: No clubbing, cyanosis, or edema. Respiratory: Normal respiratory effort, no increased work of breathing. GI: Abdomen is soft, non tender, non distended, no abdominal masses. Liver and spleen not palpable.  No hernias appreciated.  Stool sample for occult testing is not indicated.   GU: No CVA tenderness.  No bladder fullness or masses.  Patient with circumcised/uncircumcised phallus. ***Foreskin easily retracted***  Urethral meatus is patent.  No penile discharge. No penile lesions or rashes. Scrotum without lesions, cysts, rashes and/or edema.  Testicles are located scrotally bilaterally. No masses are appreciated in the testicles. Left and right epididymis are normal. Rectal: Patient with  normal sphincter tone. Anus and perineum without scarring or rashes. No rectal masses are appreciated. Prostate is approximately *** grams, *** nodules are appreciated. Seminal vesicles are normal. Skin: No rashes, bruises or suspicious lesions. Lymph: No cervical or inguinal adenopathy. Neurologic: Grossly intact, no focal deficits, moving all 4 extremities. Psychiatric: Normal mood and affect.  Laboratory Data: See Epic and HPI   I have reviewed the labs.   Pertinent Imaging: N/A  Assessment & Plan:  ***  1. rUTI's -reviewed sterile cath techniques -CATH UA *** -urine culture ordered *** -explained that Cipro  cannot  be used as a prophylactic antibiotic because of its black box warnings and his urine cultures show that the bacteria is resistant to Cipro  -maintain urine volumes below 500 cc  -will order a CT renal stone protocol to rule out stones as a nidus for infection  -if CT is negative, may need to consider gentamicin bladder instillations   2. Neurogenic bladder - continue straight caths   No follow-ups on file.  These notes generated with voice recognition software. I apologize for typographical errors.  CLOTILDA CORNWALL, PA-C  [facility] [address]  [address] [place], [place] [address] [phone]     [1] No Known Allergies

## 2024-08-29 ENCOUNTER — Encounter: Payer: Self-pay | Admitting: Urology

## 2024-08-29 ENCOUNTER — Ambulatory Visit: Admitting: Urology

## 2024-08-29 VITALS — BP 156/67 | HR 77 | Ht 68.0 in | Wt 185.0 lb

## 2024-08-29 DIAGNOSIS — N319 Neuromuscular dysfunction of bladder, unspecified: Secondary | ICD-10-CM

## 2024-08-29 DIAGNOSIS — R3129 Other microscopic hematuria: Secondary | ICD-10-CM | POA: Diagnosis not present

## 2024-08-29 DIAGNOSIS — N390 Urinary tract infection, site not specified: Secondary | ICD-10-CM

## 2024-08-29 LAB — MICROSCOPIC EXAMINATION: WBC, UA: >30 /HPF — AB (ref 0–5)

## 2024-08-29 LAB — URINALYSIS, COMPLETE
Bilirubin, UA: NEGATIVE
Glucose, UA: NEGATIVE
Ketones, UA: NEGATIVE
Nitrite, UA: NEGATIVE
Specific Gravity, UA: 1.015 (ref 1.005–1.030)
Urobilinogen, Ur: 0.2 mg/dL (ref 0.2–1.0)
pH, UA: 6 (ref 5.0–7.5)

## 2024-08-29 NOTE — Patient Instructions (Signed)
 Please Call scheduling at [phone] to schedule CT scan

## 2024-08-30 ENCOUNTER — Other Ambulatory Visit: Payer: Self-pay | Admitting: Internal Medicine

## 2024-09-02 ENCOUNTER — Ambulatory Visit

## 2024-09-03 NOTE — Telephone Encounter (Signed)
 Requested Prescriptions  Pending Prescriptions Disp Refills   traZODone  (DESYREL ) 50 MG tablet [Pharmacy Med Name: TRAZODONE  HYDROCHLORIDE 50 MG Oral Tablet] 90 tablet 0    Sig: TAKE 1 TABLET AT BEDTIME AS NEEDED FOR SLEEP     Psychiatry: Antidepressants - Serotonin Modulator Passed - 09/03/2024 10:49 AM      Passed - Valid encounter within last 6 months    Recent Outpatient Visits           2 weeks ago Recurrent UTI   [facility] [place], Angeline ORN, NP   1 month ago Self-catheterizes urinary bladder   [facility] [place], Angeline ORN, NP   2 months ago Primary hypertension   [facility] [place], Angeline ORN, NP   2 months ago Bradycardia   [facility] [place], Angeline ORN, NP   4 months ago Open wound of left lower leg, initial encounter   [facility] [place], Angeline ORN, NP       Future Appointments             In 3 months Jackquline Sawyer, MD [facility]   In 5 months MacDiarmid, Glendia, MD [facility]

## 2024-09-04 ENCOUNTER — Ambulatory Visit: Payer: Self-pay | Admitting: Urology

## 2024-09-04 DIAGNOSIS — N390 Urinary tract infection, site not specified: Secondary | ICD-10-CM

## 2024-09-04 LAB — CULTURE, URINE COMPREHENSIVE

## 2024-09-04 MED ORDER — AMOXICILLIN-POT CLAVULANATE 875-125 MG PO TABS: 1.0000 | ORAL_TABLET | Freq: Two times a day (BID) | ORAL | 0 refills | Status: DC

## 2024-09-10 ENCOUNTER — Ambulatory Visit
Admission: RE | Admit: 2024-09-10 | Discharge: 2024-09-10 | Disposition: A | Discharge status: Home / Self Care | Source: Ambulatory Visit | Attending: Urology | Admitting: Urology

## 2024-09-10 DIAGNOSIS — N319 Neuromuscular dysfunction of bladder, unspecified: Secondary | ICD-10-CM | POA: Insufficient documentation

## 2024-09-10 DIAGNOSIS — N390 Urinary tract infection, site not specified: Secondary | ICD-10-CM | POA: Insufficient documentation

## 2024-09-10 DIAGNOSIS — R3129 Other microscopic hematuria: Secondary | ICD-10-CM | POA: Diagnosis present

## 2024-09-23 ENCOUNTER — Other Ambulatory Visit: Payer: Self-pay | Admitting: Internal Medicine

## 2024-09-23 ENCOUNTER — Other Ambulatory Visit: Payer: Self-pay

## 2024-09-23 ENCOUNTER — Telehealth: Payer: Self-pay

## 2024-09-23 DIAGNOSIS — R300 Dysuria: Secondary | ICD-10-CM

## 2024-09-23 NOTE — Telephone Encounter (Signed)
 Spoke with patient in regards to lab appointment tomorrow for dysuria. Appointment scheduled for a UA and culture. Patient verbalized understanding of appointment date and time.  Andrea Kirks LPN

## 2024-09-23 NOTE — Telephone Encounter (Signed)
 Pt calls triage line and states he has completed Augmentin  however he is still having slight burning with urination and cloudy urine. He denies fever or chills. Pt prefers [facility] [place] for short term prescriptions. Please advise.

## 2024-09-24 ENCOUNTER — Other Ambulatory Visit

## 2024-09-24 DIAGNOSIS — R300 Dysuria: Secondary | ICD-10-CM

## 2024-09-24 LAB — URINALYSIS, COMPLETE
Bilirubin, UA: NEGATIVE
Glucose, UA: NEGATIVE
Ketones, UA: NEGATIVE
Nitrite, UA: NEGATIVE
Specific Gravity, UA: 1.015 (ref 1.005–1.030)
Urobilinogen, Ur: 0.2 mg/dL (ref 0.2–1.0)
pH, UA: 6 (ref 5.0–7.5)

## 2024-09-24 LAB — MICROSCOPIC EXAMINATION: WBC, UA: >30 /HPF — AB (ref 0–5)

## 2024-09-24 NOTE — Telephone Encounter (Signed)
 Requested Prescriptions  Pending Prescriptions Disp Refills   lovastatin  (MEVACOR ) 40 MG tablet [Pharmacy Med Name: LOVASTATIN  40 MG Oral Tablet] 90 tablet 0    Sig: TAKE 1 TABLET AT BEDTIME     Cardiovascular:  Antilipid - Statins 2 Failed - 09/24/2024  1:30 PM      Failed - Cr in normal range and within 360 days    Creat  Date Value Ref Range Status  01/09/2024 2.26 (H) 0.70 - 1.22 mg/dL Final   Creatinine, Ser  Date Value Ref Range Status  07/24/2024 2.27 (H) 0.61 - 1.24 mg/dL Final         Failed - Lipid Panel in normal range within the last 12 months    Cholesterol  Date Value Ref Range Status  12/22/2023 141 <200 mg/dL Final   LDL Cholesterol (Calc)  Date Value Ref Range Status  12/22/2023 65 mg/dL (calc) Final    Comment:    Reference range: <100 . Desirable range <100 mg/dL for primary prevention;   <70 mg/dL for patients with CHD or diabetic patients  with > or = 2 CHD risk factors. SABRA LDL-C is now calculated using the Martin-Hopkins  calculation, which is a validated novel method providing  better accuracy than the Friedewald equation in the  estimation of LDL-C.  Gladis APPLETHWAITE et al. SANDREA. 7986;689(80): 2061-2068  (http://education.Quest[employer].com/faq/FAQ164)    HDL  Date Value Ref Range Status  12/22/2023 60 > OR = 40 mg/dL Final   Triglycerides  Date Value Ref Range Status  12/22/2023 75 <150 mg/dL Final         Passed - Patient is not pregnant      Passed - Valid encounter within last 12 months    Recent Outpatient Visits           1 month ago Recurrent UTI   [facility] [place], Angeline ORN, NP   2 months ago Self-catheterizes urinary bladder   [facility] [place], Angeline ORN, NP   3 months ago Primary hypertension   [facility] [place], Angeline ORN, NP   3 months ago Bradycardia   [facility] [place], Angeline ORN, NP   5 months ago Open wound of  left lower leg, initial encounter   [facility] [place], Angeline ORN, NP       Future Appointments             In 2 months Jackquline Sawyer, MD [facility]   In 5 months MacDiarmid, Glendia, MD [facility]

## 2024-09-28 LAB — CULTURE, URINE COMPREHENSIVE

## 2024-09-29 ENCOUNTER — Ambulatory Visit: Payer: Self-pay | Admitting: Urology

## 2024-10-08 ENCOUNTER — Other Ambulatory Visit: Payer: Self-pay | Admitting: Dermatology

## 2024-10-08 NOTE — Telephone Encounter (Signed)
 Patient states that he is not flared at time. Advised patient to contact us  should he flare in the future and we can send in medication then.

## 2024-10-10 ENCOUNTER — Ambulatory Visit: Payer: Self-pay

## 2024-10-10 NOTE — Telephone Encounter (Signed)
 FYI Only or Action Required?: FYI only for provider: appointment scheduled on 10/11/24.  Patient was last seen in primary care on 08/16/2024 by Antonette Angeline ORN, NP.  Called Nurse Triage reporting Dysuria.  Symptoms began unspecified.  Interventions attempted: Nothing.  Symptoms are: gradually worsening.  Triage Disposition: See Physician Within 24 Hours  Patient/caregiver understands and will follow disposition?: Yes  Reason for Disposition  All other males with painful urination  Answer Assessment - Initial Assessment Questions Pt states having burning with urination and cloudy urine, states not sure when it started. Has had 3 UTIs over 6 months. *Pt did not want to contact urology, would like to be seen in PCP office.   4. ONSET: When did the painful urination start?      Patient states he is not sure 5. FEVER: Do you have a fever? If Yes, ask: What is your temperature, how was it measured, and when did it start?     Denies 6. PAST UTI: Have you had a urine infection before? If Yes, ask: When was the last time? and What happened that time?      Yes, states 3 in the past 6 months 7. CAUSE: What do you think is causing the painful urination?      UTI 8. OTHER SYMPTOMS: Do you have any other symptoms? (e.g., flank pain, penis discharge, scrotal pain, blood in urine)     Cloudy urine  Protocols used: Urination Pain - Male-A-AH

## 2024-10-10 NOTE — Telephone Encounter (Signed)
 Will discuss at upcoming appointment

## 2024-10-11 ENCOUNTER — Ambulatory Visit (INDEPENDENT_AMBULATORY_CARE_PROVIDER_SITE_OTHER): Admitting: Internal Medicine

## 2024-10-11 ENCOUNTER — Encounter: Payer: Self-pay | Admitting: Internal Medicine

## 2024-10-11 VITALS — BP 130/78 | HR 52 | Ht 68.0 in | Wt 188.6 lb

## 2024-10-11 DIAGNOSIS — N319 Neuromuscular dysfunction of bladder, unspecified: Secondary | ICD-10-CM

## 2024-10-11 DIAGNOSIS — N390 Urinary tract infection, site not specified: Secondary | ICD-10-CM

## 2024-10-11 LAB — POCT URINE DIPSTICK
Bilirubin, UA: NEGATIVE
Glucose, UA: NEGATIVE mg/dL
Ketones, POC UA: NEGATIVE mg/dL
Nitrite, UA: NEGATIVE
POC PROTEIN,UA: 100 — AB
Spec Grav, UA: 1.015
Urobilinogen, UA: 0.2 U/dL
pH, UA: 6

## 2024-10-11 MED ORDER — SULFAMETHOXAZOLE-TRIMETHOPRIM 400-80 MG PO TABS: 1.0000 | ORAL_TABLET | Freq: Two times a day (BID) | ORAL | 0 refills | Status: AC

## 2024-10-11 NOTE — Patient Instructions (Signed)
 UTI (Urinary Tract Infection) in Males: What to Know  A urinary tract infection (UTI) is an infection in your urinary tract. The urinary tract is made up of the organs that make, store, and get rid of pee (urine) in your body. These organs include: The kidneys. The ureters. The bladder. The urethra. What are the causes? Most UTIs are caused by germs called bacteria. They may be in or near your genitals. These germs grow and cause swelling in your urinary tract. What increases the risk? You're more likely to get a UTI if: You have a soft tube called a catheter that drains your pee. You can't control when you pee or poop. You have trouble peeing because of: A prostate that's bigger than normal. A kidney stone. A urinary blockage. A nerve condition that affects your bladder. Not getting enough to drink. You're sexually active. You're an older adult. You're also more likely to get a UTI if you have other health problems, such as: Diabetes. A weak immune system. Your immune system is your body's defense system. Sickle cell disease. Injury of the spine. What are the signs or symptoms? Symptoms may include: Needing to pee right away. Peeing small amounts often. Trouble getting the stream started. Pain or burning when you pee. Blood in your pee. Pee that smells bad or odd. Pain in your belly or lower back. You may also: Feel confused. This may be the first symptom in older adults. Vomit. Not feel hungry. Feel tired or easily annoyed. Have a fever or chills. How is this diagnosed? A UTI is diagnosed based on your medical history and an exam. You may also have other tests. These may include: Pee tests. Blood tests. Tests for sexually transmitted infections (STIs). If you've had more than one UTI, you may need to have imaging studies done to find out why you keep getting them. How is this treated? A UTI can be treated by: Taking antibiotics or other medicines. Drinking enough  fluid to keep your pee pale yellow. In rare cases, a UTI can cause a very bad condition called sepsis. Sepsis may be treated in the hospital. Follow these instructions at home: Medicines Take your medicines only as told by your health care provider. If you were given antibiotics, take them as told by your provider. Do not stop taking them even if you start to feel better. General instructions Make sure you: Pee often and fully. Do not hold your pee for a long time. Pee after you have sex. Contact a health care provider if: Your symptoms don't get better after 1-2 days of taking antibiotics. Your symptoms go away and then come back. You have a fever or chills. You vomit or feel like you may vomit. Get help right away if: You have very bad pain in your back or lower belly. You faint. This information is not intended to replace advice given to you by your health care provider. Make sure you discuss any questions you have with your health care provider. Document Revised: 07/11/2024 Document Reviewed: 12/02/2022 Elsevier Patient Education  2025 [employer].

## 2024-10-13 LAB — URINE CULTURE
MICRO NUMBER:: 17532706
SPECIMEN QUALITY:: ADEQUATE

## 2024-10-14 ENCOUNTER — Ambulatory Visit: Payer: Self-pay | Admitting: Internal Medicine

## 2024-11-18 ENCOUNTER — Other Ambulatory Visit: Admitting: Urology

## 2024-12-09 ENCOUNTER — Ambulatory Visit: Admitting: Dermatology

## 2024-12-24 ENCOUNTER — Encounter: Admitting: Internal Medicine

## 2024-12-27 ENCOUNTER — Ambulatory Visit

## 2025-01-01 ENCOUNTER — Ambulatory Visit

## 2025-02-24 ENCOUNTER — Ambulatory Visit: Admitting: Urology
